# Patient Record
Sex: Male | Born: 1960 | Race: White | Hispanic: No | Marital: Single | Smoking: Current every day smoker
Health system: Southern US, Community
[De-identification: ages and names within clinical notes are randomized; demographics above are authoritative.]

## PROBLEM LIST (undated history)

## (undated) DIAGNOSIS — I10 Essential (primary) hypertension: Secondary | ICD-10-CM

## (undated) DIAGNOSIS — G8929 Other chronic pain: Secondary | ICD-10-CM

## (undated) DIAGNOSIS — J449 Chronic obstructive pulmonary disease, unspecified: Secondary | ICD-10-CM

## (undated) DIAGNOSIS — R110 Nausea: Secondary | ICD-10-CM

## (undated) DIAGNOSIS — M549 Dorsalgia, unspecified: Secondary | ICD-10-CM

## (undated) DIAGNOSIS — F329 Major depressive disorder, single episode, unspecified: Secondary | ICD-10-CM

## (undated) DIAGNOSIS — K635 Polyp of colon: Secondary | ICD-10-CM

## (undated) DIAGNOSIS — Z8719 Personal history of other diseases of the digestive system: Secondary | ICD-10-CM

## (undated) DIAGNOSIS — K5730 Diverticulosis of large intestine without perforation or abscess without bleeding: Secondary | ICD-10-CM

## (undated) DIAGNOSIS — K219 Gastro-esophageal reflux disease without esophagitis: Secondary | ICD-10-CM

## (undated) DIAGNOSIS — K625 Hemorrhage of anus and rectum: Secondary | ICD-10-CM

## (undated) DIAGNOSIS — Z860101 Personal history of adenomatous and serrated colon polyps: Secondary | ICD-10-CM

## (undated) DIAGNOSIS — F32A Depression, unspecified: Secondary | ICD-10-CM

## (undated) DIAGNOSIS — D369 Benign neoplasm, unspecified site: Secondary | ICD-10-CM

## (undated) DIAGNOSIS — R0602 Shortness of breath: Secondary | ICD-10-CM

## (undated) DIAGNOSIS — M545 Low back pain, unspecified: Secondary | ICD-10-CM

## (undated) DIAGNOSIS — R109 Unspecified abdominal pain: Secondary | ICD-10-CM

## (undated) DIAGNOSIS — A048 Other specified bacterial intestinal infections: Secondary | ICD-10-CM

## (undated) DIAGNOSIS — I219 Acute myocardial infarction, unspecified: Secondary | ICD-10-CM

## (undated) HISTORY — PX: FINGER SURGERY: SHX640

## (undated) HISTORY — DX: Benign neoplasm, unspecified site: D36.9

## (undated) HISTORY — DX: Polyp of colon: K63.5

## (undated) HISTORY — DX: Low back pain, unspecified: M54.50

## (undated) HISTORY — DX: Personal history of adenomatous and serrated colon polyps: Z86.0101

## (undated) HISTORY — DX: Low back pain: M54.5

## (undated) HISTORY — DX: Other specified bacterial intestinal infections: A04.8

## (undated) HISTORY — DX: Personal history of other diseases of the digestive system: Z87.19

## (undated) HISTORY — DX: Diverticulosis of large intestine without perforation or abscess without bleeding: K57.30

---

## 1998-11-22 DIAGNOSIS — T8859XA Other complications of anesthesia, initial encounter: Secondary | ICD-10-CM

## 1998-11-22 HISTORY — PX: BACK SURGERY: SHX140

## 1998-11-22 HISTORY — DX: Other complications of anesthesia, initial encounter: T88.59XA

## 2009-08-19 ENCOUNTER — Emergency Department (HOSPITAL_COMMUNITY): Admission: EM | Admit: 2009-08-19 | Discharge: 2009-08-19 | Payer: Self-pay | Admitting: Emergency Medicine

## 2009-08-19 IMAGING — CR DG LUMBAR SPINE COMPLETE 4+V
5 series · 5 of 5 positions shown · non-contrast
Comparison: None

CLINICAL DATA: Fall and back pain

LUMBAR SPINE - COMPLETE 4+ VIEW

[view not recorded (1 of 5)]
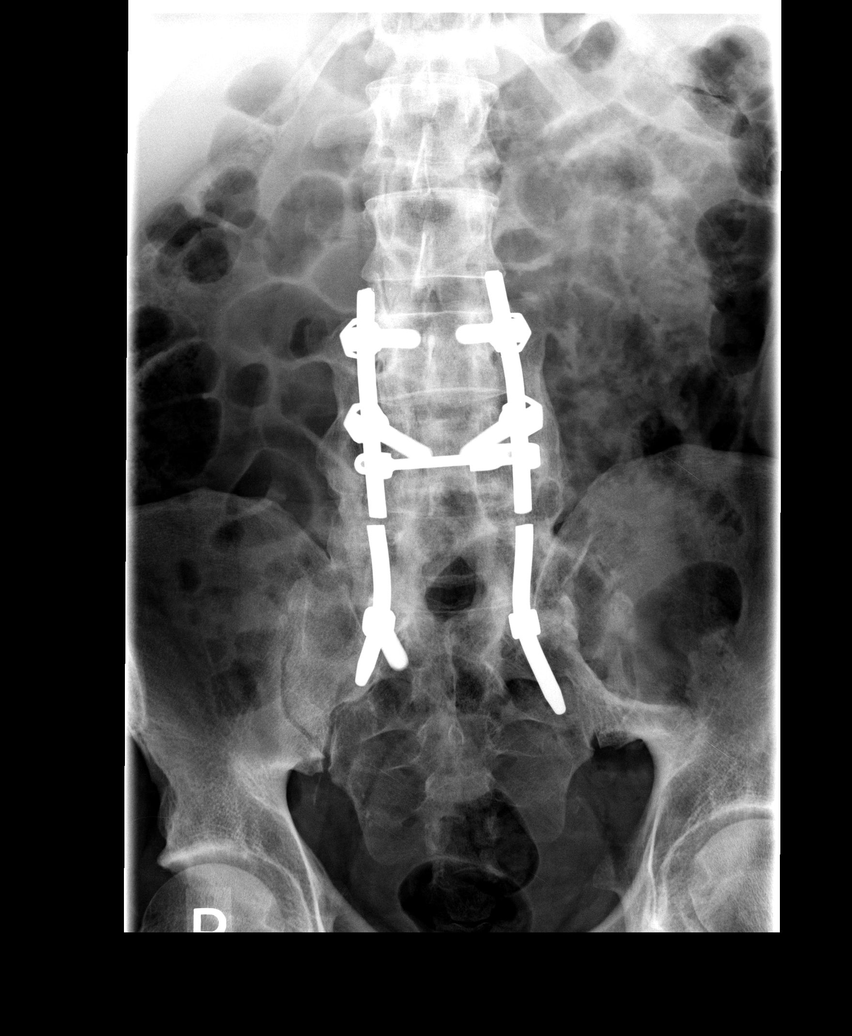

[view not recorded (2 of 5)]
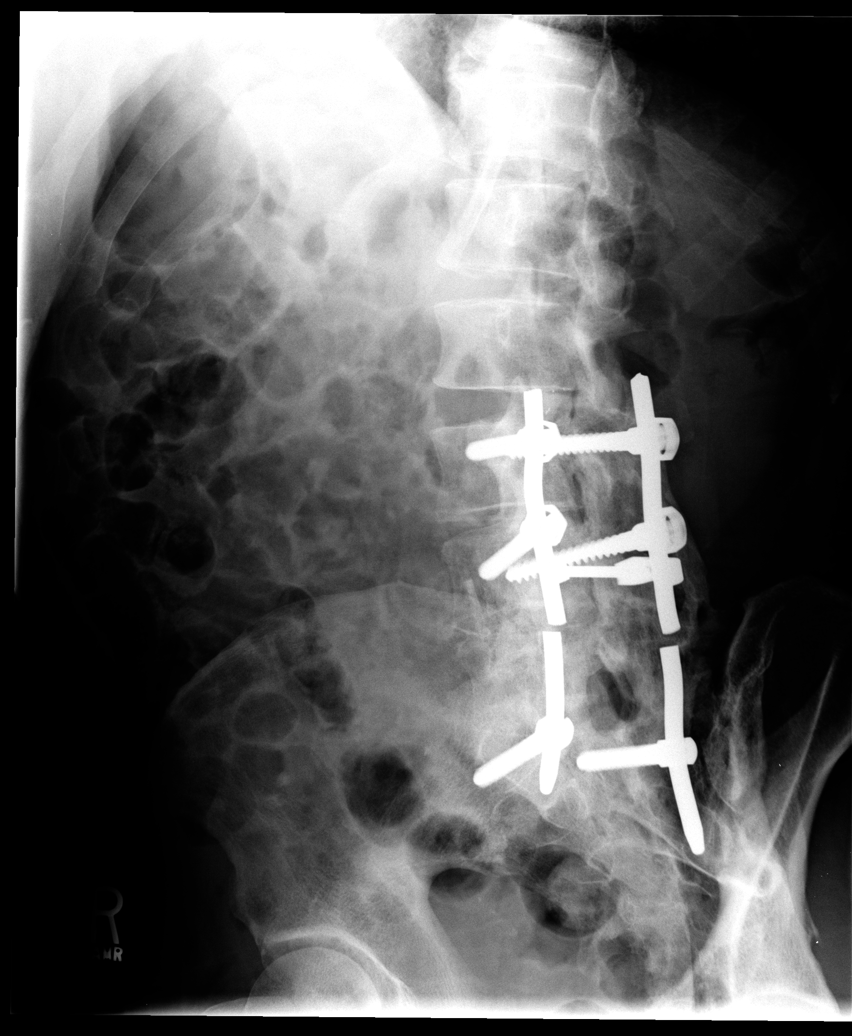

[view not recorded (3 of 5)]
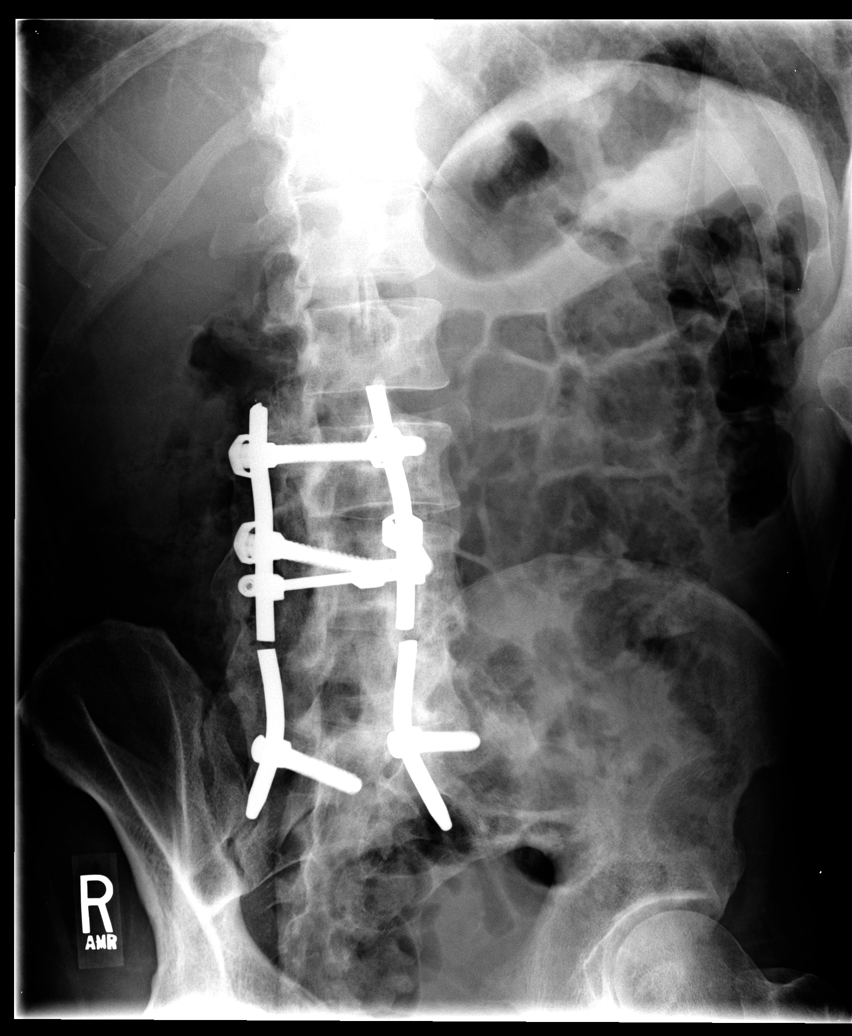

[view not recorded (4 of 5)]
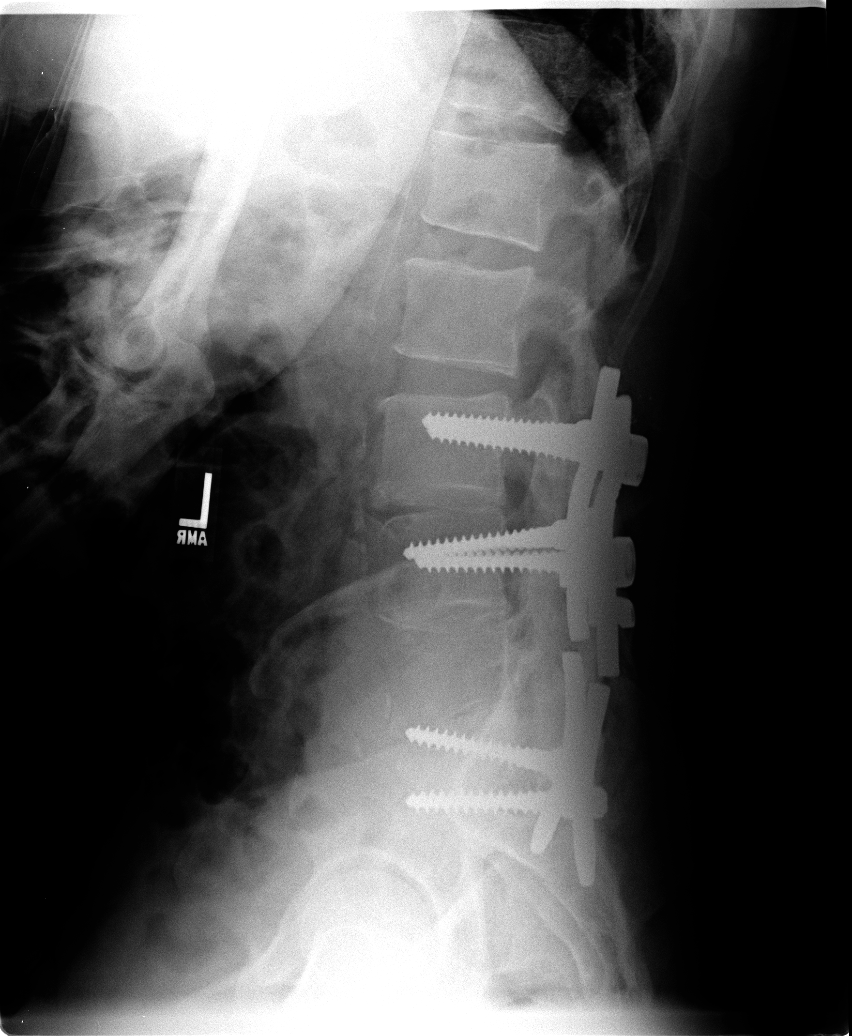

[view not recorded (5 of 5)]
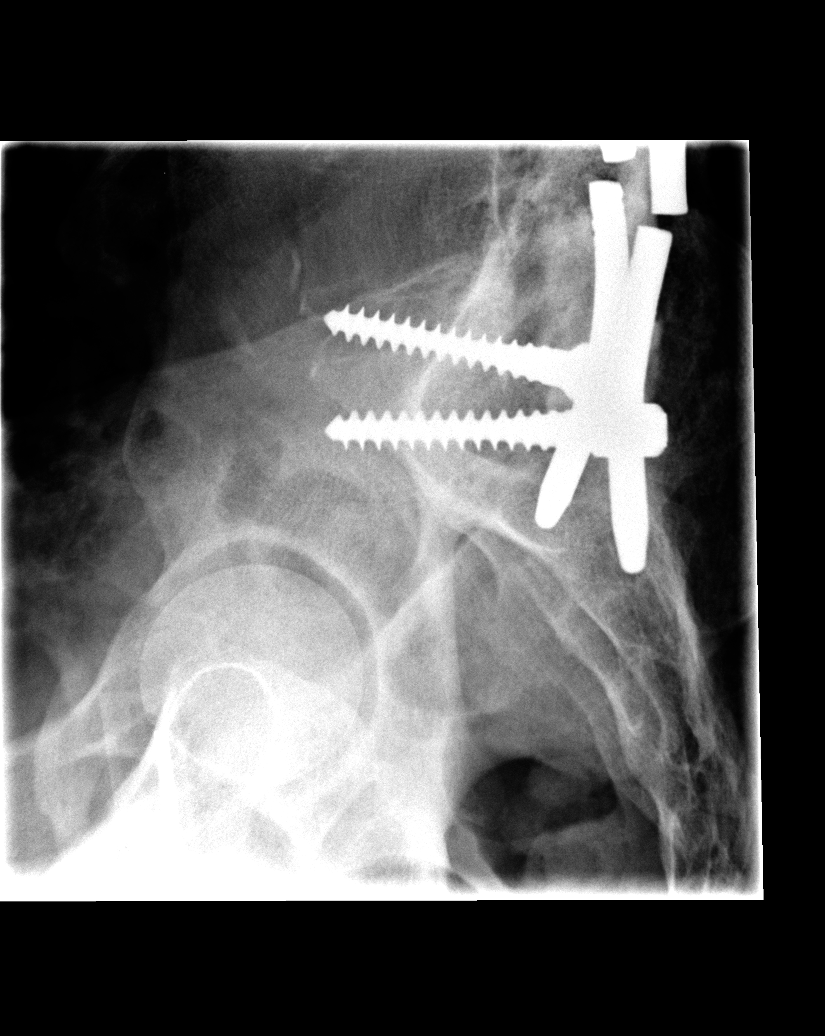

[5 of 5 positions shown; findings below may reference images not displayed]

FINDINGS: Bilateral pedicle screws are present in L3, L4, and S1.
A cross stabilizing bars, connecting the pedicle screws is
fractured at the L5 level.  L5 is demineralized.  No vertebral body
height loss.  Degenerative changes.  The L4-5 and L5 S1 disc levels
are visible.
IMPRESSION: Posterior L3-S1 fusion as described.  The cross stabilizing bars
are fractured bilaterally.  No vertebral compression deformity.

## 2009-08-19 IMAGING — CR DG CHEST 2V
3 series · 3 of 3 positions shown · non-contrast
Comparison: None

CLINICAL DATA: Back pain.

CHEST - 2 VIEW

[view not recorded (1 of 3)]
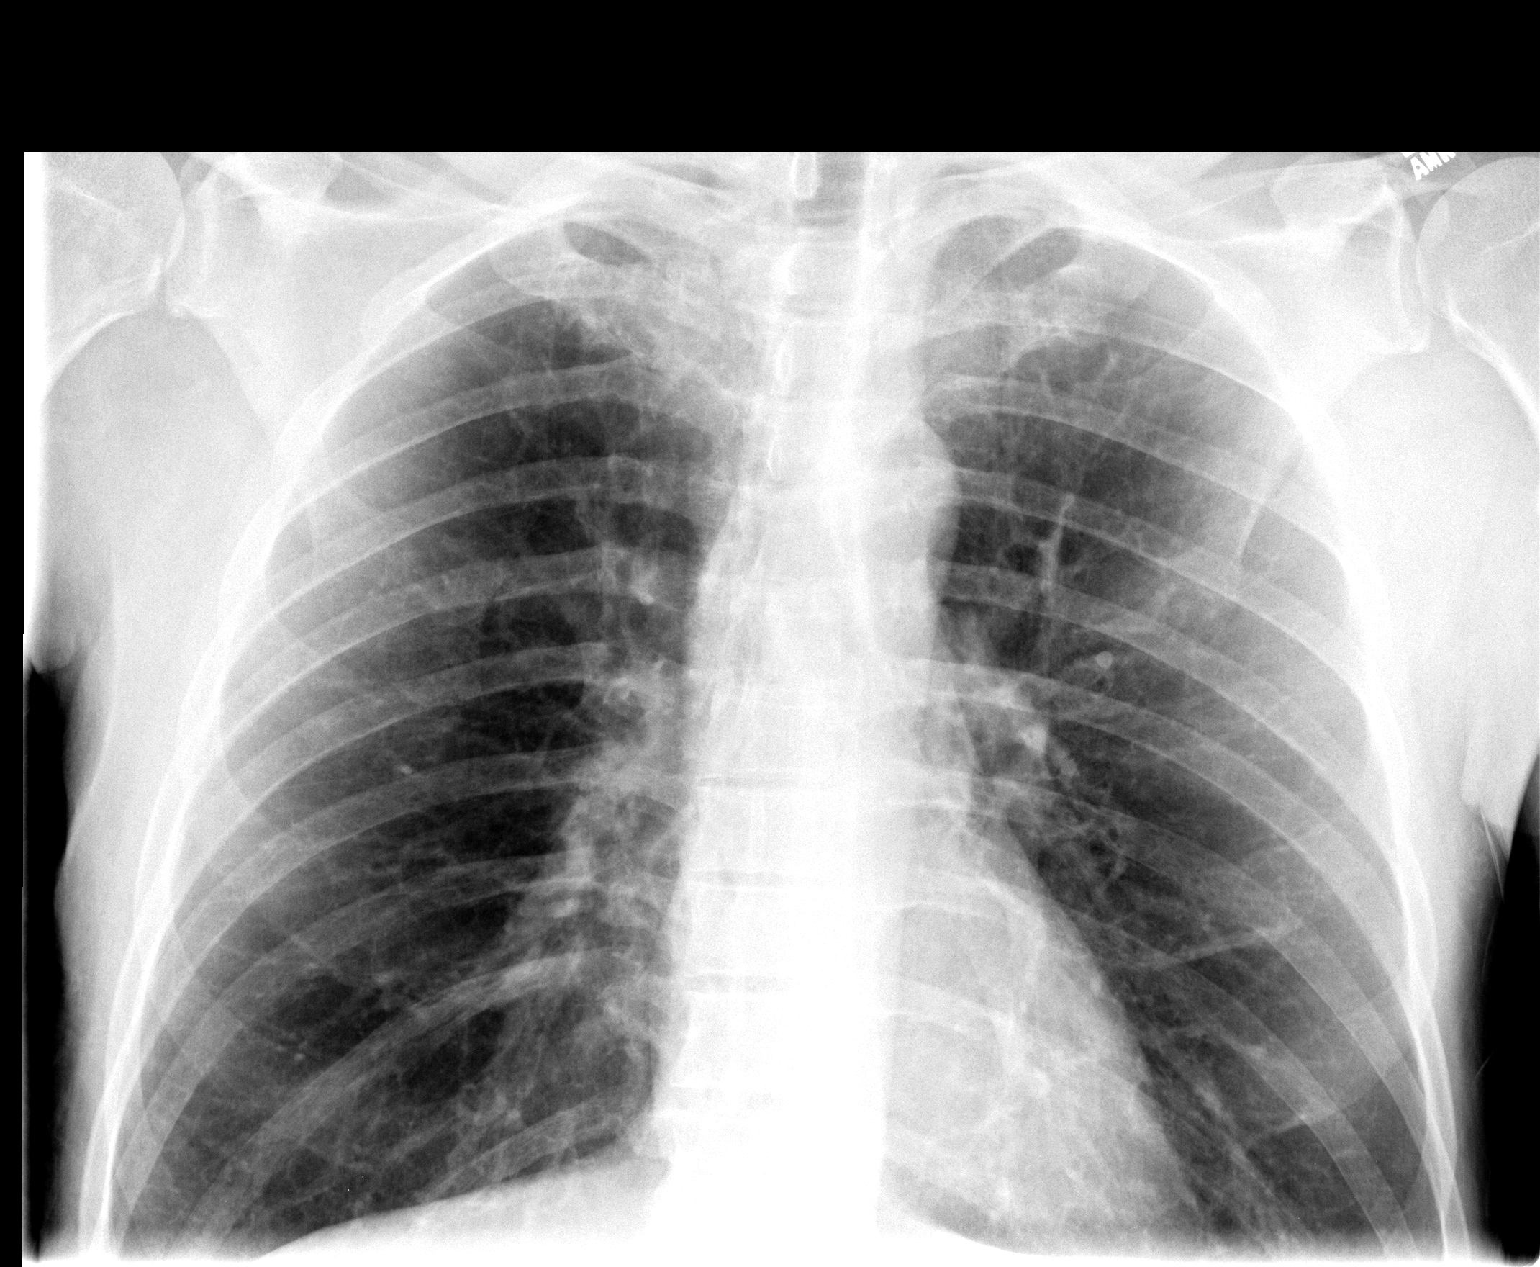

[view not recorded (2 of 3)]
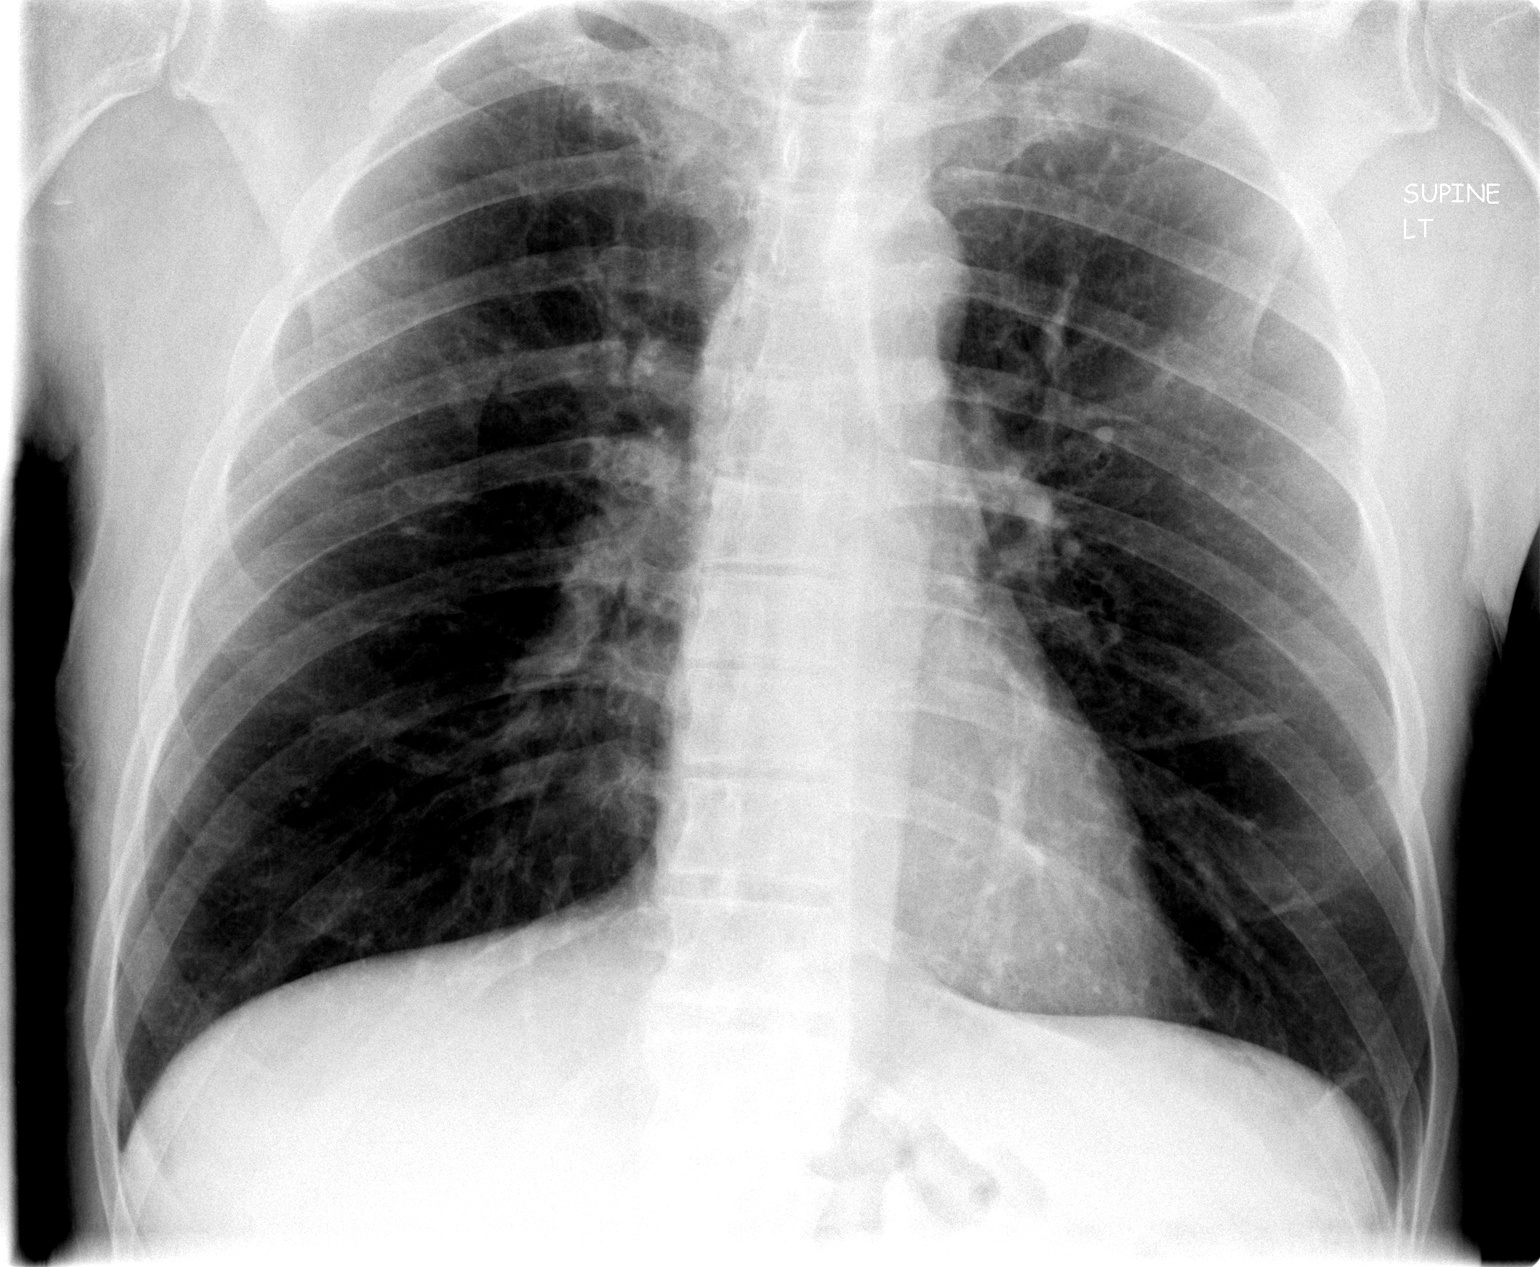

[view not recorded (3 of 3)]
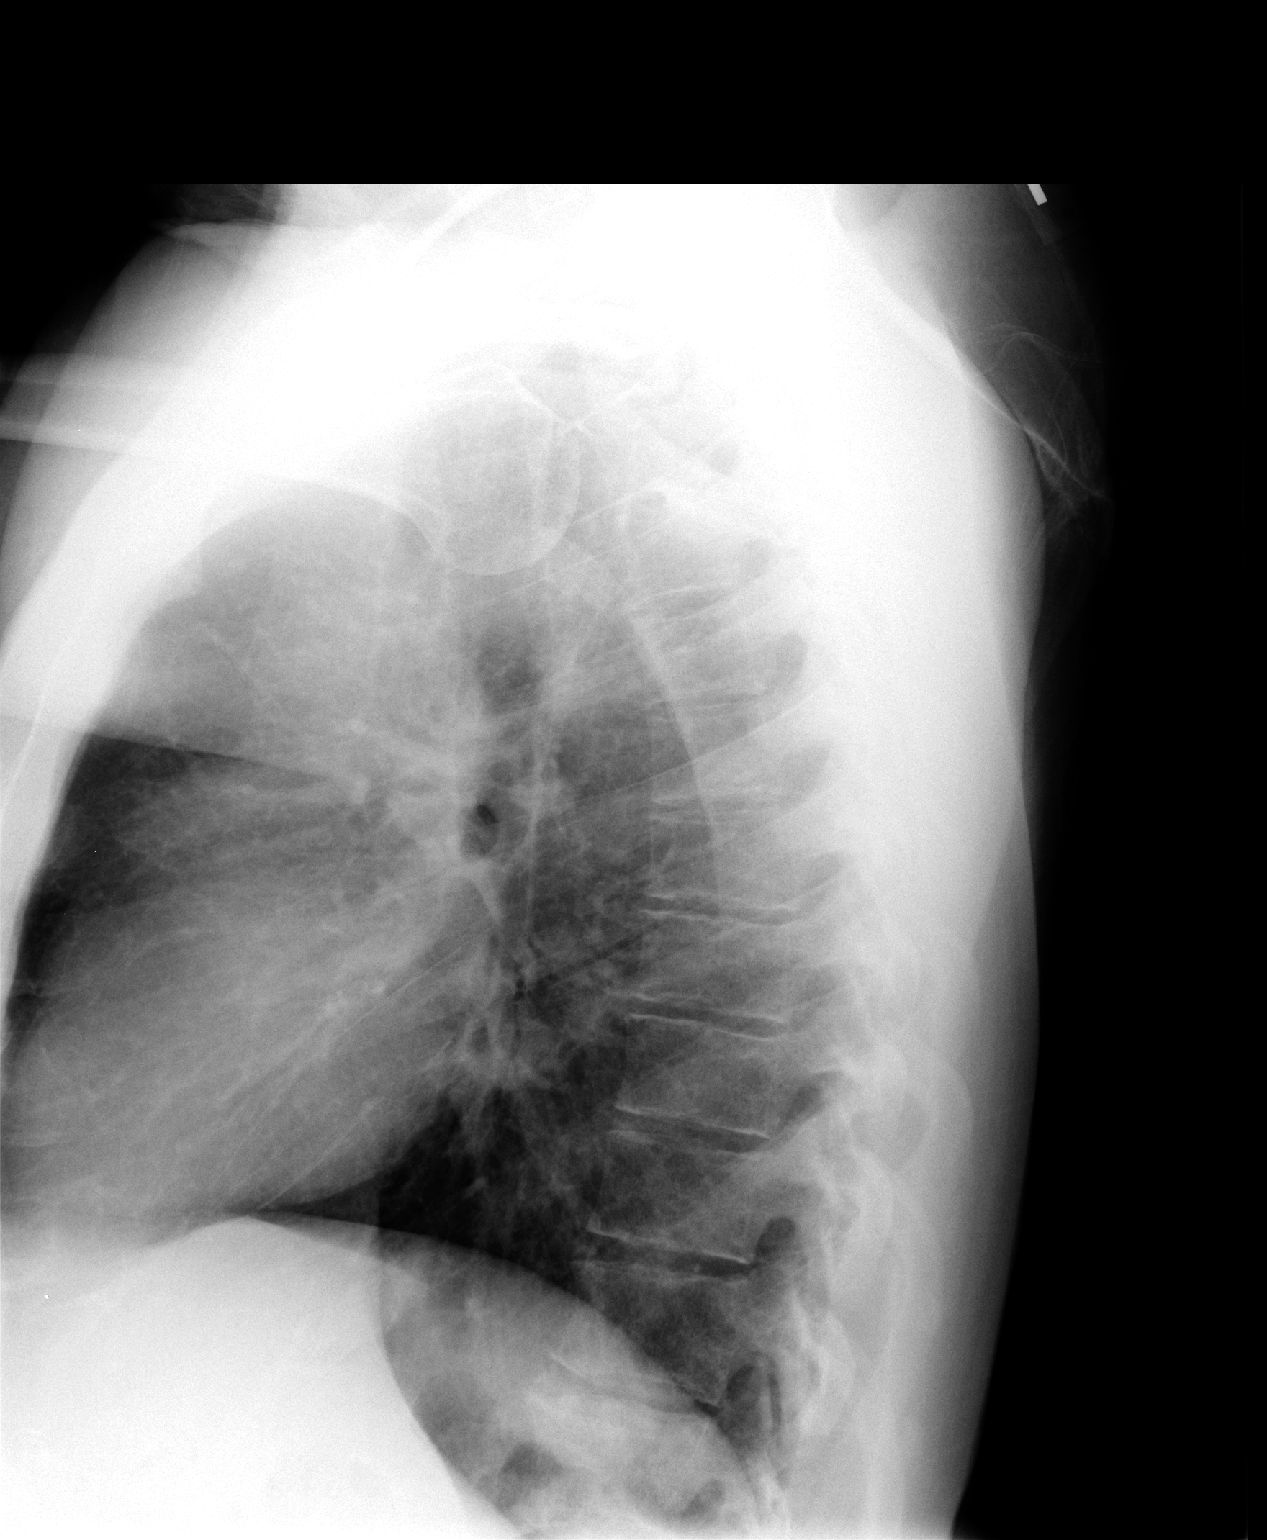

[3 of 3 positions shown; findings below may reference images not displayed]

FINDINGS: Normal heart size.  Clear lungs.  No pneumothorax.  No
pleural effusion.  No definite acute bony deformity.
IMPRESSION: No active cardiopulmonary disease.

## 2009-12-27 ENCOUNTER — Inpatient Hospital Stay: Payer: Self-pay | Admitting: Psychiatry

## 2010-04-08 ENCOUNTER — Emergency Department (HOSPITAL_COMMUNITY): Admission: EM | Admit: 2010-04-08 | Discharge: 2010-04-08 | Payer: Self-pay | Admitting: Emergency Medicine

## 2010-04-08 ENCOUNTER — Ambulatory Visit: Payer: Self-pay | Admitting: Psychiatry

## 2010-07-02 ENCOUNTER — Emergency Department (HOSPITAL_COMMUNITY)
Admission: EM | Admit: 2010-07-02 | Discharge: 2010-07-02 | Payer: Self-pay | Source: Home / Self Care | Admitting: Emergency Medicine

## 2010-09-11 ENCOUNTER — Inpatient Hospital Stay (HOSPITAL_COMMUNITY): Admission: AD | Admit: 2010-09-11 | Discharge: 2010-04-16 | Payer: Self-pay | Admitting: Psychiatry

## 2010-12-21 LAB — RAPID URINE DRUG SCREEN, HOSP PERFORMED
Amphetamines: NOT DETECTED
Barbiturates: NOT DETECTED
Benzodiazepines: NOT DETECTED
Cocaine: NOT DETECTED
Opiates: NOT DETECTED
Tetrahydrocannabinol: POSITIVE — AB

## 2010-12-21 LAB — CBC
HCT: 46.8 % (ref 39.0–52.0)
Hemoglobin: 15.8 g/dL (ref 13.0–17.0)
MCH: 31.1 pg (ref 26.0–34.0)
MCHC: 33.8 g/dL (ref 30.0–36.0)
MCV: 92 fL (ref 78.0–100.0)
Platelets: 291 10*3/uL (ref 150–400)
RBC: 5.08 MIL/uL (ref 4.22–5.81)
RDW: 13.9 % (ref 11.5–15.5)
WBC: 11.4 10*3/uL — ABNORMAL HIGH (ref 4.0–10.5)

## 2010-12-21 LAB — DIFFERENTIAL
Basophils Absolute: 0.1 10*3/uL (ref 0.0–0.1)
Basophils Relative: 1 % (ref 0–1)
Eosinophils Absolute: 0.1 10*3/uL (ref 0.0–0.7)
Eosinophils Relative: 1 % (ref 0–5)
Lymphocytes Relative: 27 % (ref 12–46)
Lymphs Abs: 3.1 10*3/uL (ref 0.7–4.0)
Monocytes Absolute: 0.6 10*3/uL (ref 0.1–1.0)
Monocytes Relative: 6 % (ref 3–12)
Neutro Abs: 7.5 10*3/uL (ref 1.7–7.7)
Neutrophils Relative %: 66 % (ref 43–77)

## 2010-12-21 LAB — BASIC METABOLIC PANEL
BUN: 9 mg/dL (ref 6–23)
CO2: 25 mEq/L (ref 19–32)
Calcium: 9.9 mg/dL (ref 8.4–10.5)
Chloride: 106 mEq/L (ref 96–112)
Creatinine, Ser: 1.06 mg/dL (ref 0.4–1.5)
GFR calc Af Amer: >60 mL/min (ref >60)
GFR calc non Af Amer: >60 mL/min (ref >60)
Glucose, Bld: 94 mg/dL (ref 70–99)
Potassium: 3.7 mEq/L (ref 3.5–5.1)
Sodium: 138 mEq/L (ref 135–145)

## 2010-12-21 LAB — ETHANOL: Alcohol, Ethyl (B): <5 mg/dL (ref 0–10)

## 2011-01-07 LAB — BASIC METABOLIC PANEL
BUN: 4 mg/dL — ABNORMAL LOW (ref 6–23)
CO2: 24 mEq/L (ref 19–32)
Calcium: 9.4 mg/dL (ref 8.4–10.5)
Chloride: 104 mEq/L (ref 96–112)
Creatinine, Ser: 1.01 mg/dL (ref 0.4–1.5)
GFR calc Af Amer: >60 mL/min (ref >60)
GFR calc non Af Amer: >60 mL/min (ref >60)
Glucose, Bld: 94 mg/dL (ref 70–99)
Potassium: 3.4 mEq/L — ABNORMAL LOW (ref 3.5–5.1)
Sodium: 138 mEq/L (ref 135–145)

## 2011-01-07 LAB — CBC
HCT: 46.8 % (ref 39.0–52.0)
Hemoglobin: 16.1 g/dL (ref 13.0–17.0)
MCHC: 34.4 g/dL (ref 30.0–36.0)
MCV: 91.6 fL (ref 78.0–100.0)
Platelets: 263 10*3/uL (ref 150–400)
RBC: 5.11 MIL/uL (ref 4.22–5.81)
RDW: 14 % (ref 11.5–15.5)
WBC: 13 10*3/uL — ABNORMAL HIGH (ref 4.0–10.5)

## 2011-01-07 LAB — DIFFERENTIAL
Basophils Absolute: 0.1 10*3/uL (ref 0.0–0.1)
Basophils Relative: 1 % (ref 0–1)
Eosinophils Absolute: 0.1 10*3/uL (ref 0.0–0.7)
Eosinophils Relative: 1 % (ref 0–5)
Lymphocytes Relative: 26 % (ref 12–46)
Lymphs Abs: 3.3 10*3/uL (ref 0.7–4.0)
Monocytes Absolute: 0.7 10*3/uL (ref 0.1–1.0)
Monocytes Relative: 5 % (ref 3–12)
Neutro Abs: 8.8 10*3/uL — ABNORMAL HIGH (ref 1.7–7.7)
Neutrophils Relative %: 68 % (ref 43–77)

## 2011-07-27 ENCOUNTER — Emergency Department (HOSPITAL_COMMUNITY): Payer: Medicaid Other

## 2011-07-27 ENCOUNTER — Emergency Department (HOSPITAL_COMMUNITY)
Admission: EM | Admit: 2011-07-27 | Discharge: 2011-07-27 | Disposition: A | Discharge status: Home / Self Care | Payer: Medicaid Other | Attending: Emergency Medicine | Admitting: Emergency Medicine

## 2011-07-27 DIAGNOSIS — S239XXA Sprain of unspecified parts of thorax, initial encounter: Secondary | ICD-10-CM | POA: Insufficient documentation

## 2011-07-27 DIAGNOSIS — W19XXXA Unspecified fall, initial encounter: Secondary | ICD-10-CM

## 2011-07-27 DIAGNOSIS — Y92009 Unspecified place in unspecified non-institutional (private) residence as the place of occurrence of the external cause: Secondary | ICD-10-CM | POA: Insufficient documentation

## 2011-07-27 DIAGNOSIS — W108XXA Fall (on) (from) other stairs and steps, initial encounter: Secondary | ICD-10-CM | POA: Insufficient documentation

## 2011-07-27 DIAGNOSIS — F17200 Nicotine dependence, unspecified, uncomplicated: Secondary | ICD-10-CM | POA: Insufficient documentation

## 2011-07-27 DIAGNOSIS — S161XXA Strain of muscle, fascia and tendon at neck level, initial encounter: Secondary | ICD-10-CM

## 2011-07-27 DIAGNOSIS — S139XXA Sprain of joints and ligaments of unspecified parts of neck, initial encounter: Secondary | ICD-10-CM | POA: Insufficient documentation

## 2011-07-27 IMAGING — CT CT T SPINE W/O CM
3 of 5 series · 11 of 33 positions shown, 13 images · non-contrast
Comparison: Chest radiograph and lumbar radiographs [DATE].

CT CERVICAL SPINE WITHOUT CONTRAST

CLINICAL DATA: 50-year-old male status post fall with pain.
TECHNIQUE: Multidetector CT imaging of the cervical spine was
performed without intravenous contrast.  Multiplanar CT image
reconstructions were also generated.
TECHNIQUE: Multidetector CT imaging of the thoracic spine was
performed without intravenous contrast administration. Multiplanar
CT image reconstructions were also generated.
TECHNIQUE: Multidetector CT imaging of the lumbar spine was
performed without intravenous contrast administration.  Multiplanar

[Series 3: thoracic spine 2.0 b30s · axial · 0.30mm/px · z∈[-305,+63]mm · 5 of 266 slices shown, 7 images]
[im 41/266  soft-tissue]
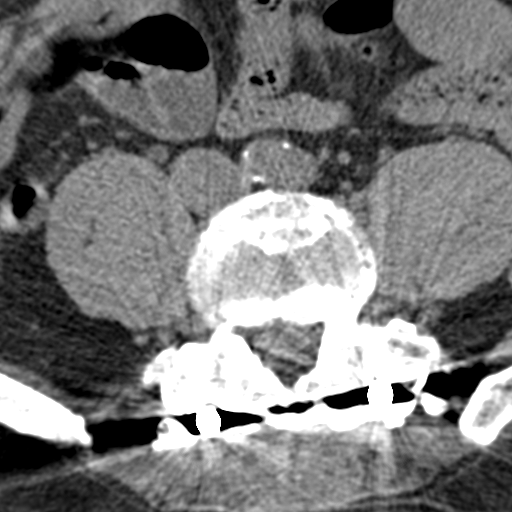
[im 41/266  bone]
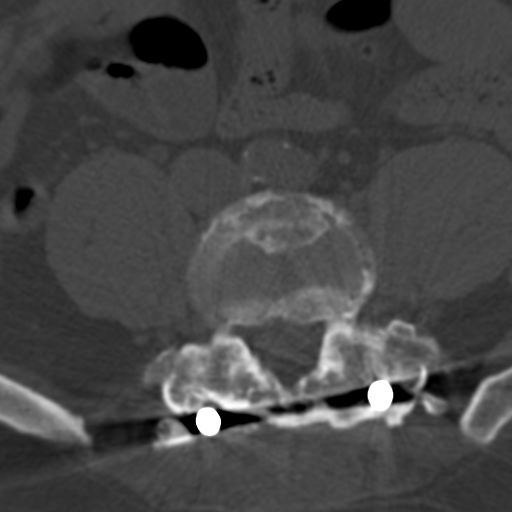
[im 82/266  bone]
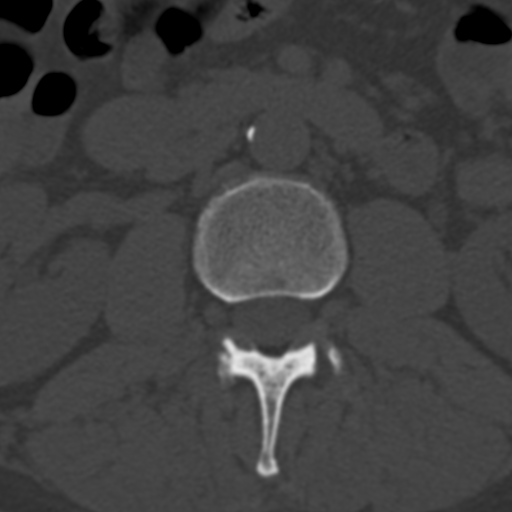
[im 143/266  bone]
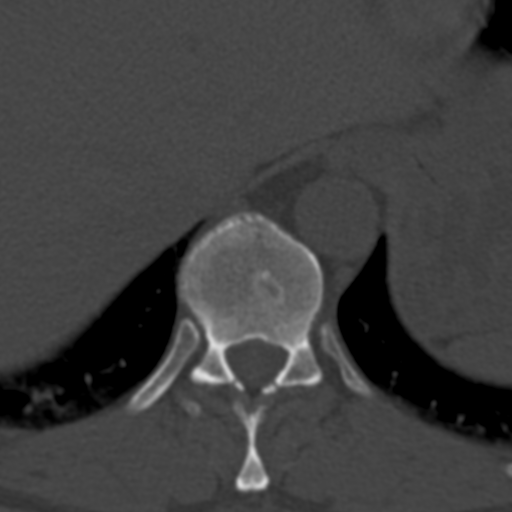
[im 184/266  bone]
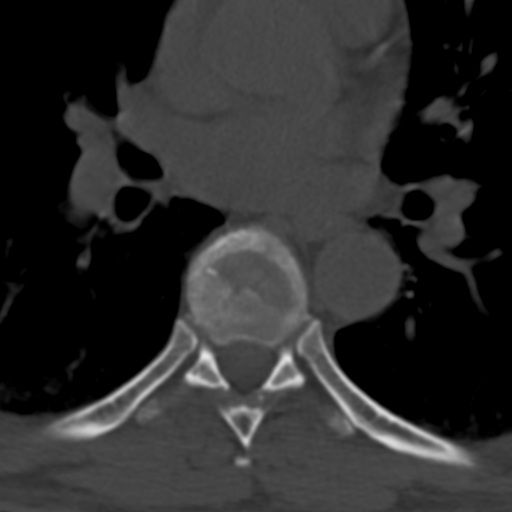
[im 225/266  soft-tissue]
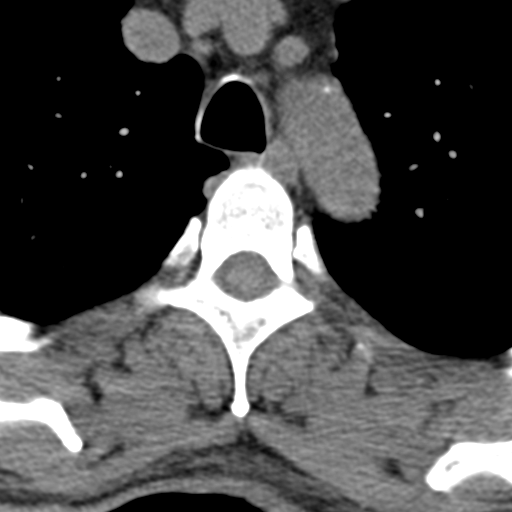
[im 225/266  bone]
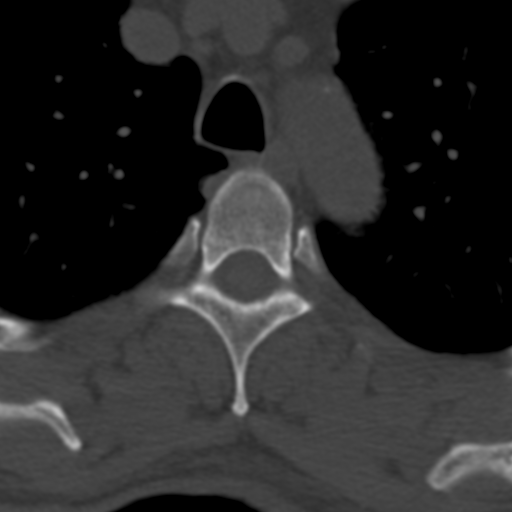

[Series 5: thoracic spine 2.0 spo · coronal · 0.34mm/px · 1 of 70 slices shown]
[im 35/70  bone]
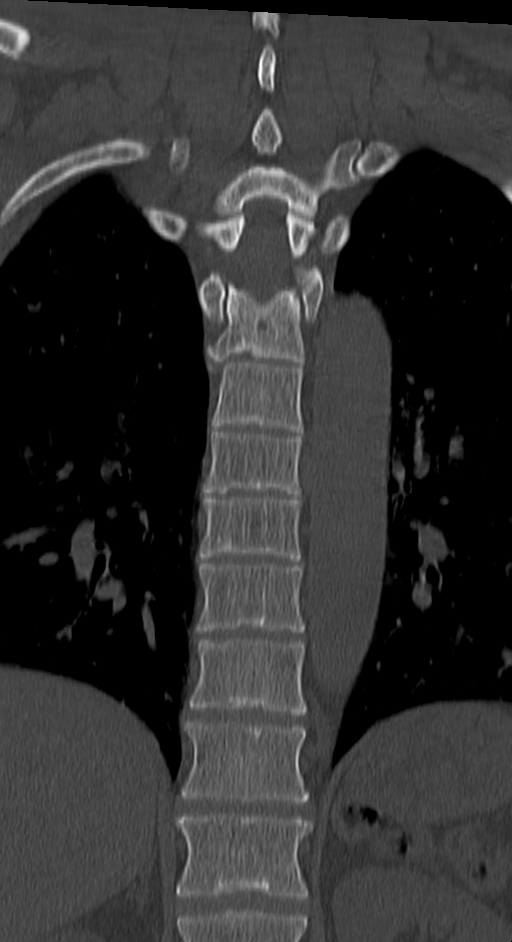

[Series 8: lspine 2.0 spo · sagittal · 0.28mm/px · 5 of 58 slices shown]
[im 10/58  bone]
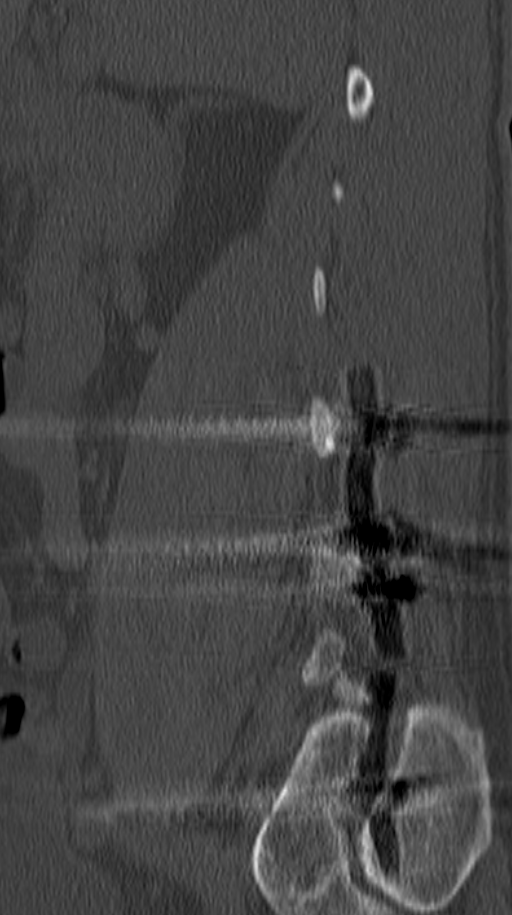
[im 20/58  bone]
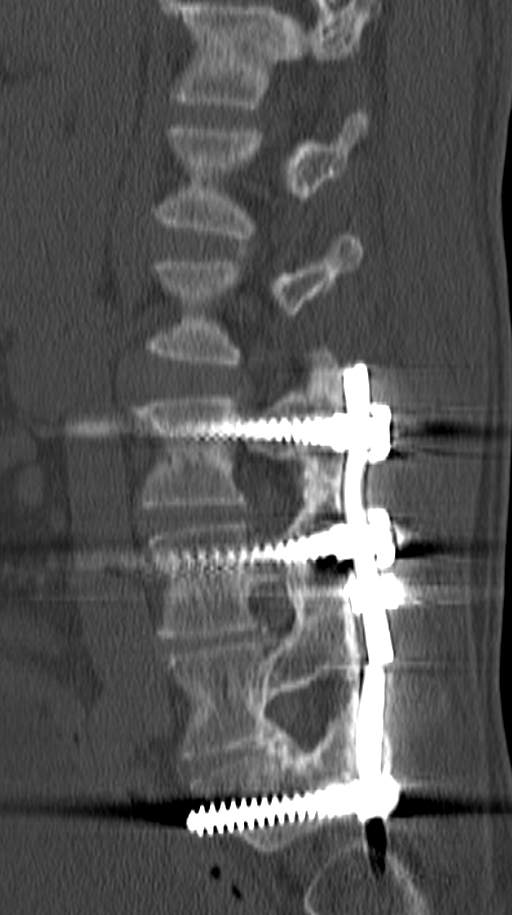
[im 29/58  bone]
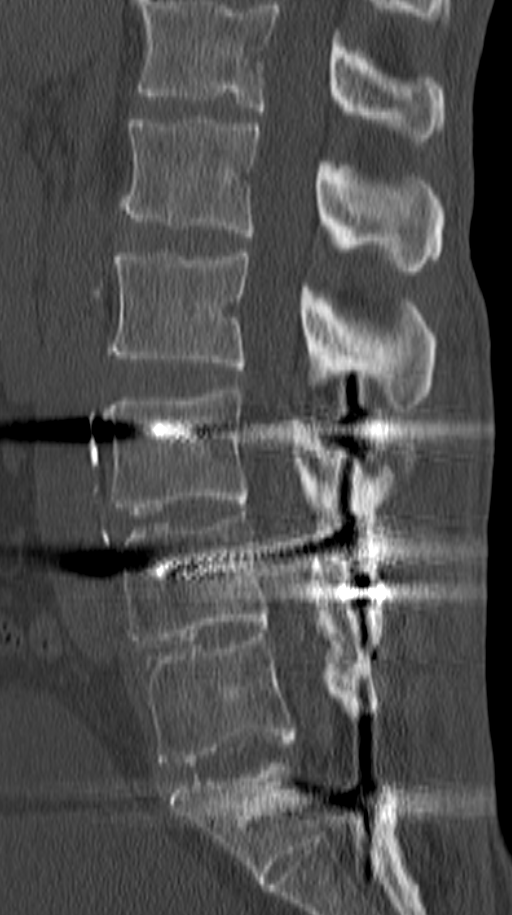
[im 39/58  bone]
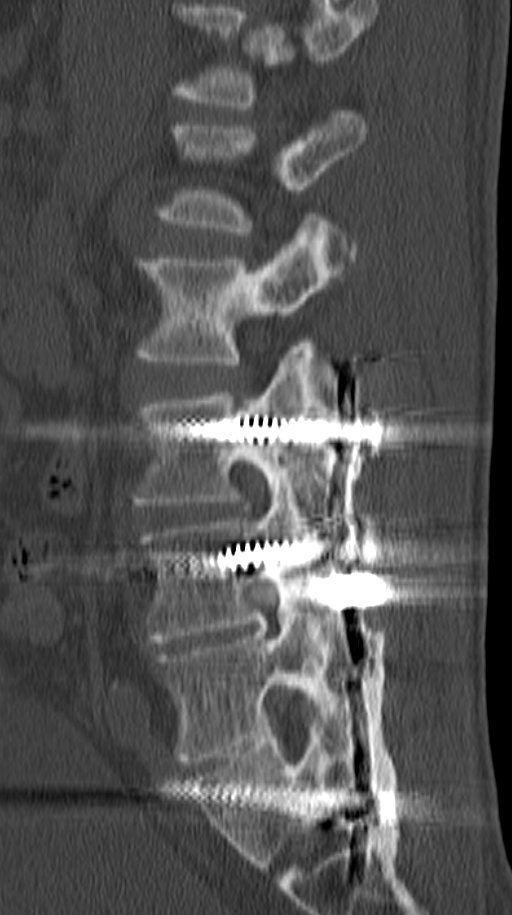
[im 48/58  bone]
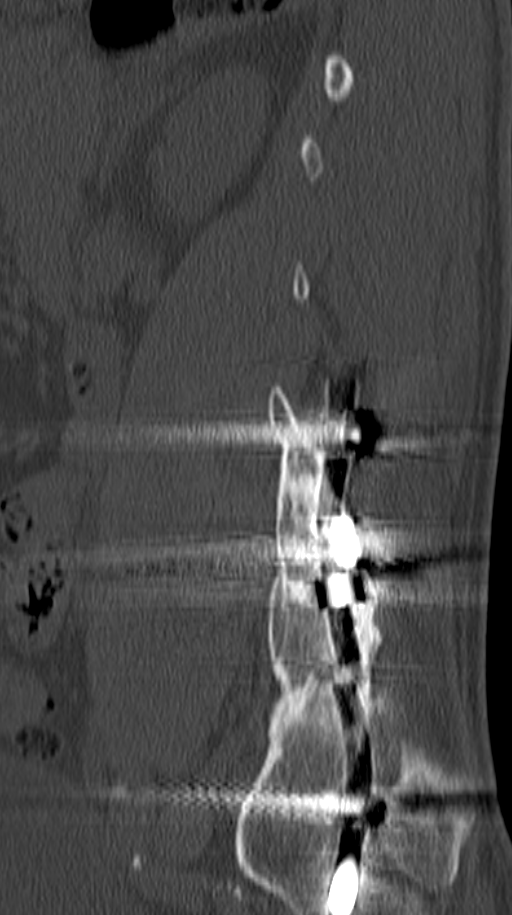

[11 of 33 positions shown; findings below may reference images not displayed]

FINDINGS: Negative visualized posterior fossa soft tissues.
Visualized paraspinal soft tissues are within normal limits.

Straightening of cervical lordosis. Cervicothoracic junction
alignment is within normal limits.  Bilateral posterior element
alignment is within normal limits.  Visualized skull base is
intact.  No atlanto-occipital dissociation.  Disc bulges and
endplate spurring at multiple levels, most pronounced at the C6-C7
where there is probably mild spinal stenosis (AP thecal sac 7-8
mm).  There is moderate bilateral neural foraminal stenosis also at
this level.  No acute cervical fracture.
IMPRESSION: 1.  No acute fracture or listhesis identified in the cervical
spine.  Ligamentous injury is not excluded.
2.  Cervical disc degeneration most pronounced at C6-C7 with
probable mild spinal stenosis.
3.  Thoracic and lumbar findings are below.

CT THORACIC SPINE WITHOUT CONTRAST
FINDINGS: Thoracic vertebral height and alignment appears stable
and within normal limits.  No acute fracture identified.

No CT evidence of thoracic spinal stenosis.

Negative noncontrast visualized mediastinal viscera. Visualized
paraspinal soft tissues are within normal limits.

Centrilobular emphysema.  Perihilar bronchiectasis.  Dependent
atelectasis.

Visualized posterior ribs appear intact.
IMPRESSION: 1.  No fracture or acute findings evident in the thoracic spine.
2.  Emphysema.
3.  Lumbar findings are below.

CT LUMBAR SPINE WITHOUT CONTRAST
FINDINGS: The bladder is distended.  There is calcified
atherosclerosis of the abdominal aorta and iliac arteries.
Otherwise negative visualized soft tissues.

Postoperative changes with fusion from L3-8 to the sacrum.
Hardware details below.  Stable lumbar vertebral height and
alignment.  Postoperative changes to the medial left iliac bone.
Visualized sacrum appears intact.

Upper lumbar levels are intact.  There is a mild circumferential
disc bulge at L2-L3 plus severe posterior element hypertrophy.  No
significant spinal stenosis (AP thecal sac 10-11 mm).

There is a chronic fracture of the posterior connecting rods as
seen on the [LY] comparison.  There is solid posterior element
arthrodesis from L3 to the sacrum.  Transpedicular hardware at the
L3, L4, and S1 levels is intact without evidence of loosening.  No
fracture through these levels.  The spinal canal is intermittently
obscured by metal artifact.  No spinal stenosis is evident.
IMPRESSION: 1.  Stable and solid fusion from L3 to the sacrum.  Chronic
connecting rod fracture appears unchanged since [DATE].  Mild adjacent segment disease at L2-L3.
3.  No acute finding identified in the lumbar spine.

## 2011-07-27 IMAGING — CT CT T SPINE W/O CM
3 of 5 series · 11 of 33 positions shown, 13 images · non-contrast
Comparison: Chest radiograph and lumbar radiographs [DATE].

CT CERVICAL SPINE WITHOUT CONTRAST

CLINICAL DATA: 50-year-old male status post fall with pain.
TECHNIQUE: Multidetector CT imaging of the cervical spine was
performed without intravenous contrast.  Multiplanar CT image
reconstructions were also generated.
TECHNIQUE: Multidetector CT imaging of the thoracic spine was
performed without intravenous contrast administration. Multiplanar
CT image reconstructions were also generated.
TECHNIQUE: Multidetector CT imaging of the lumbar spine was
performed without intravenous contrast administration.  Multiplanar

[Series 4: cervical bone cp portal · axial · portal-venous · 0.30mm/px · z∈[+105,+233]mm · 3 of 194 slices shown, 4 images]
[im 33/194  soft-tissue]
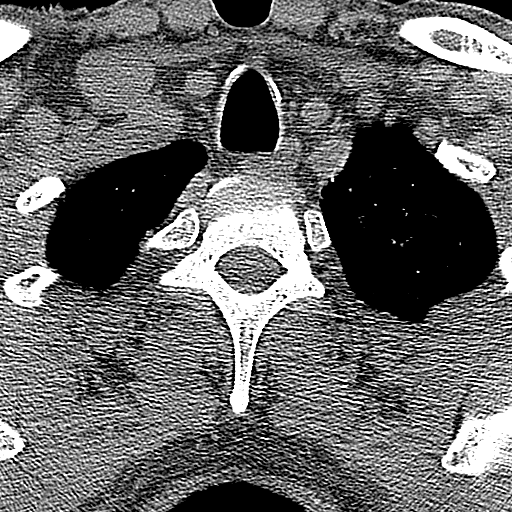
[im 33/194  bone]
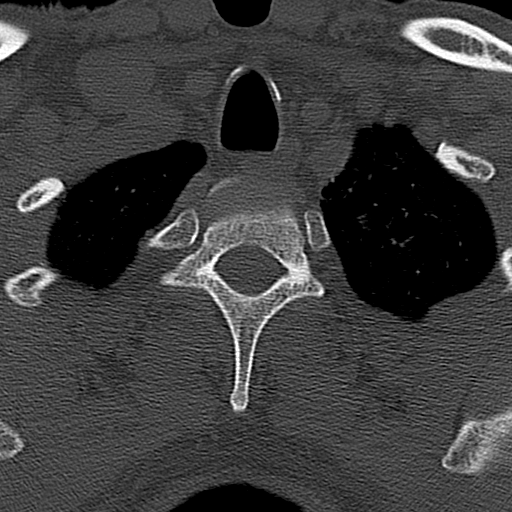
[im 97/194  bone]
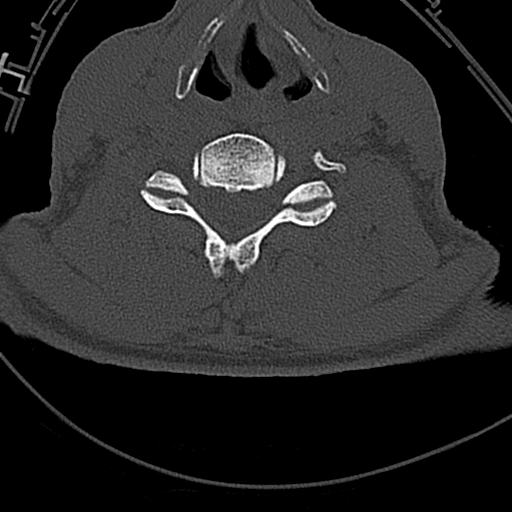
[im 161/194  bone]
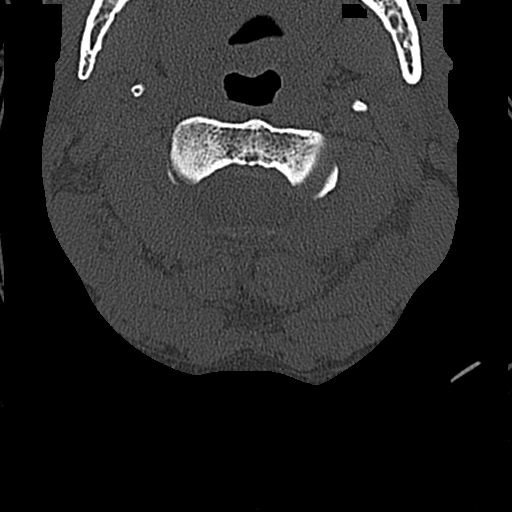

[Series 5: cervical spine sagittal bone · sagittal · 0.25mm/px · 5 of 47 slices shown, 6 images]
[im 16/47  bone]
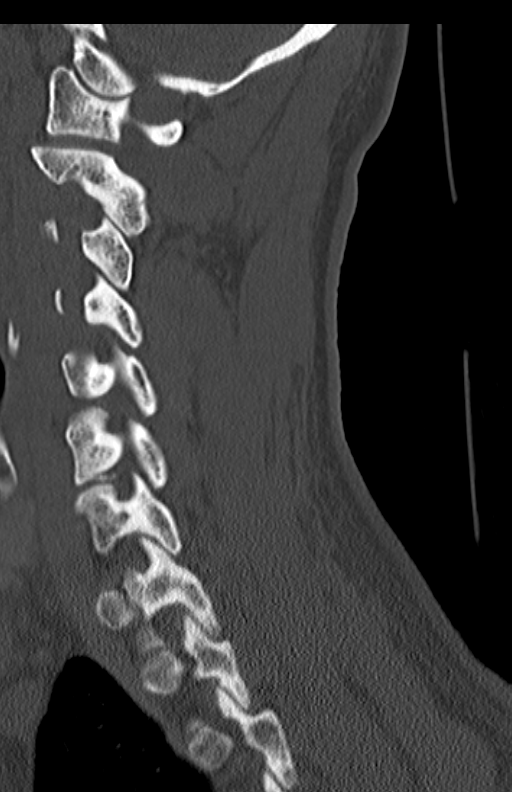
[im 20/47  bone]
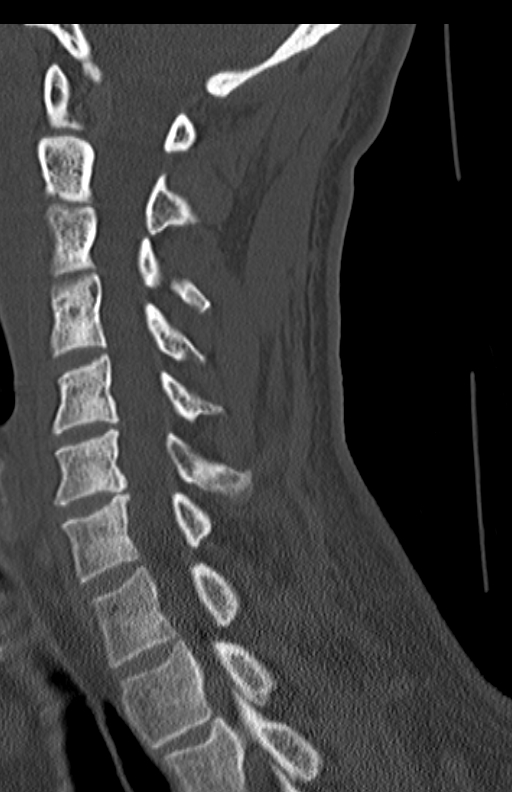
[im 24/47  soft-tissue]
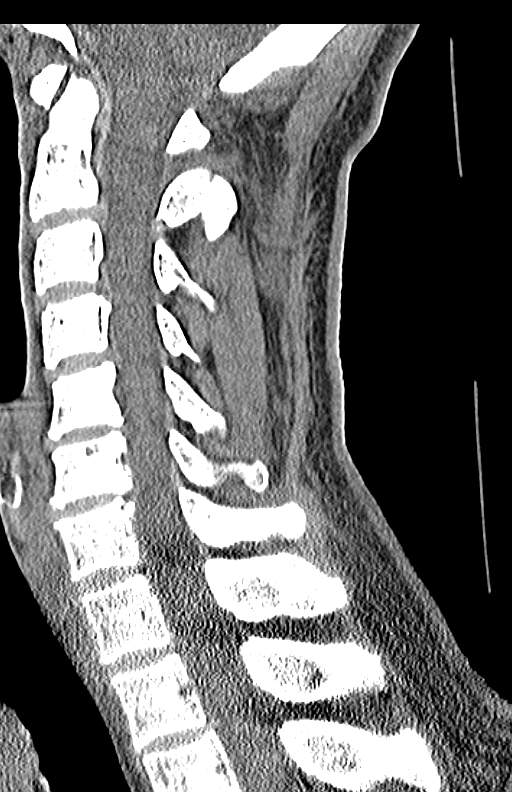
[im 24/47  bone]
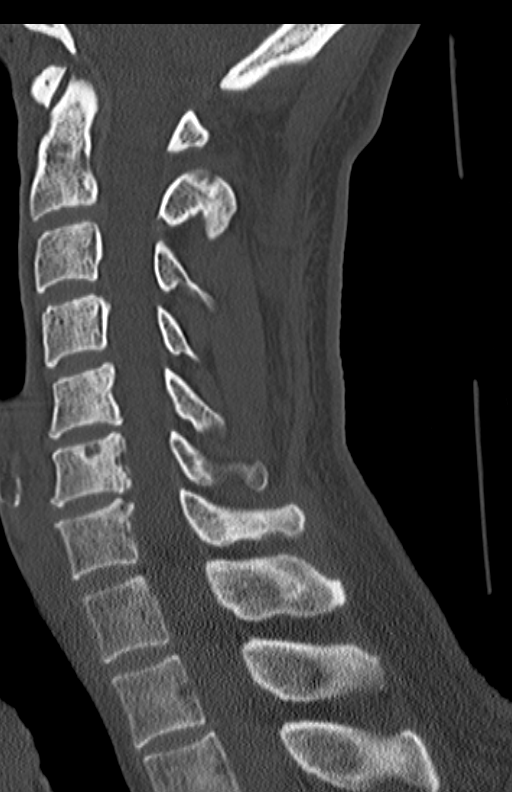
[im 27/47  bone]
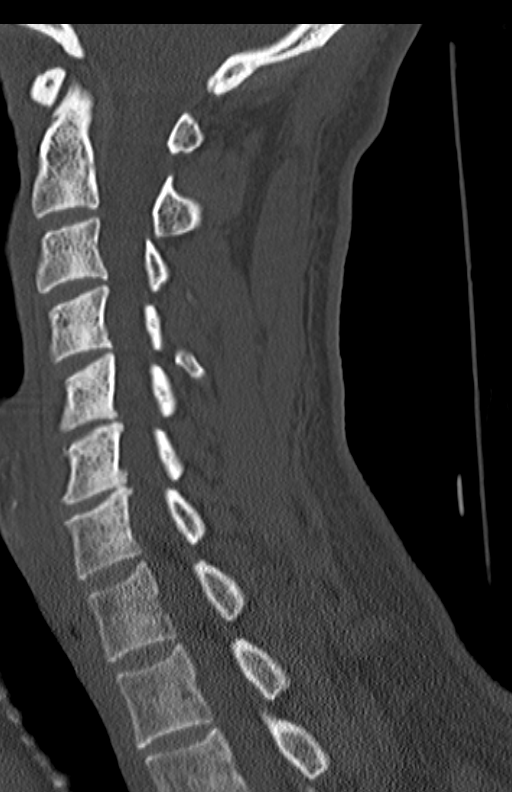
[im 31/47  bone]
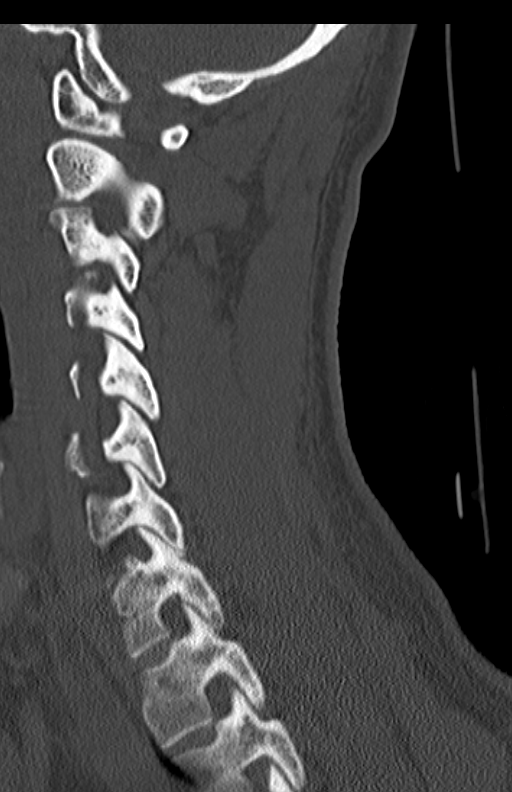

[Series 6: cervical spine coronal bone · coronal · 0.25mm/px · 3 of 65 slices shown]
[im 13/65  bone]
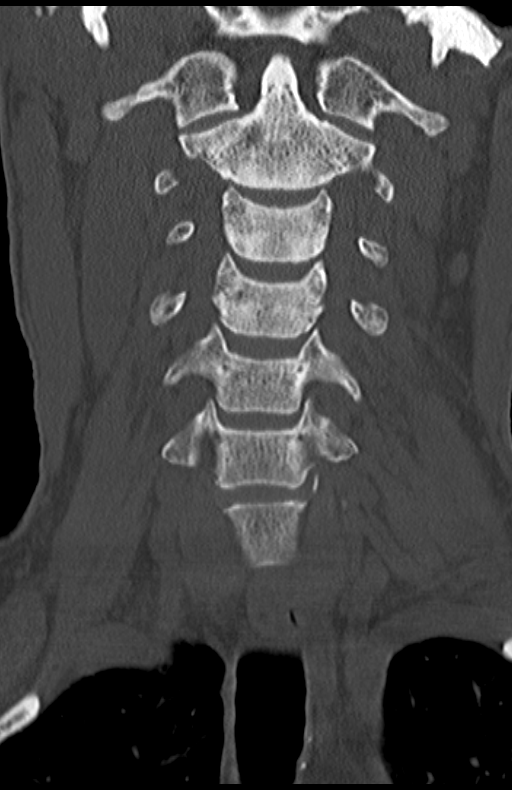
[im 26/65  bone]
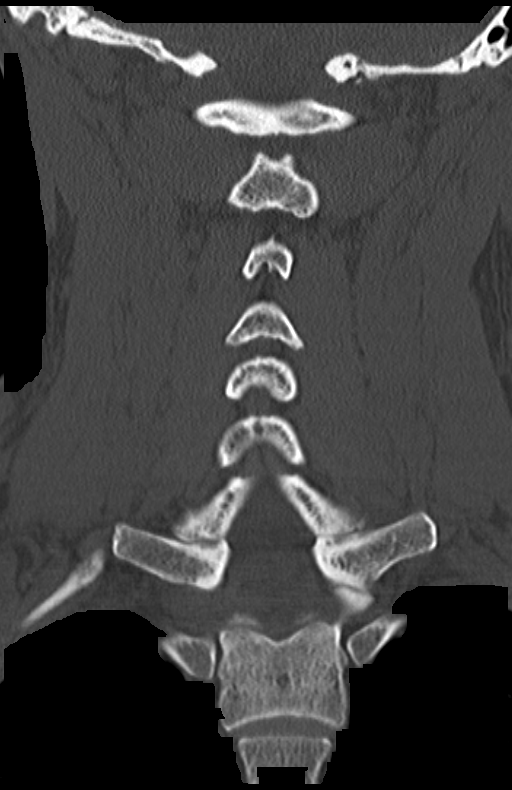
[im 39/65  bone]
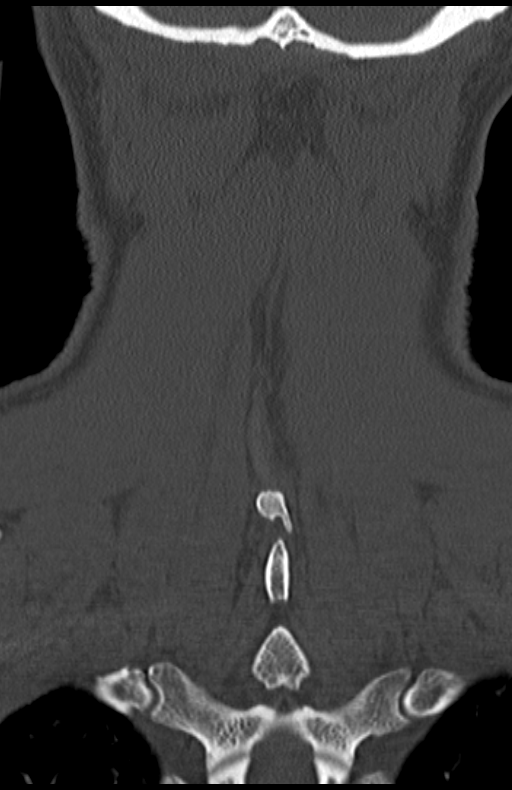

[11 of 33 positions shown; findings below may reference images not displayed]

FINDINGS: Negative visualized posterior fossa soft tissues.
Visualized paraspinal soft tissues are within normal limits.

Straightening of cervical lordosis. Cervicothoracic junction
alignment is within normal limits.  Bilateral posterior element
alignment is within normal limits.  Visualized skull base is
intact.  No atlanto-occipital dissociation.  Disc bulges and
endplate spurring at multiple levels, most pronounced at the C6-C7
where there is probably mild spinal stenosis (AP thecal sac 7-8
mm).  There is moderate bilateral neural foraminal stenosis also at
this level.  No acute cervical fracture.
IMPRESSION: 1.  No acute fracture or listhesis identified in the cervical
spine.  Ligamentous injury is not excluded.
2.  Cervical disc degeneration most pronounced at C6-C7 with
probable mild spinal stenosis.
3.  Thoracic and lumbar findings are below.

CT THORACIC SPINE WITHOUT CONTRAST
FINDINGS: Thoracic vertebral height and alignment appears stable
and within normal limits.  No acute fracture identified.

No CT evidence of thoracic spinal stenosis.

Negative noncontrast visualized mediastinal viscera. Visualized
paraspinal soft tissues are within normal limits.

Centrilobular emphysema.  Perihilar bronchiectasis.  Dependent
atelectasis.

Visualized posterior ribs appear intact.
IMPRESSION: 1.  No fracture or acute findings evident in the thoracic spine.
2.  Emphysema.
3.  Lumbar findings are below.

CT LUMBAR SPINE WITHOUT CONTRAST
FINDINGS: The bladder is distended.  There is calcified
atherosclerosis of the abdominal aorta and iliac arteries.
Otherwise negative visualized soft tissues.

Postoperative changes with fusion from L3-8 to the sacrum.
Hardware details below.  Stable lumbar vertebral height and
alignment.  Postoperative changes to the medial left iliac bone.
Visualized sacrum appears intact.

Upper lumbar levels are intact.  There is a mild circumferential
disc bulge at L2-L3 plus severe posterior element hypertrophy.  No
significant spinal stenosis (AP thecal sac 10-11 mm).

There is a chronic fracture of the posterior connecting rods as
seen on the [LY] comparison.  There is solid posterior element
arthrodesis from L3 to the sacrum.  Transpedicular hardware at the
L3, L4, and S1 levels is intact without evidence of loosening.  No
fracture through these levels.  The spinal canal is intermittently
obscured by metal artifact.  No spinal stenosis is evident.
IMPRESSION: 1.  Stable and solid fusion from L3 to the sacrum.  Chronic
connecting rod fracture appears unchanged since [DATE].  Mild adjacent segment disease at L2-L3.
3.  No acute finding identified in the lumbar spine.

## 2011-07-27 MED ORDER — OXYCODONE-ACETAMINOPHEN 5-325 MG PO TABS: 1.0000 | ORAL_TABLET | ORAL | Status: AC | PRN

## 2011-07-27 MED ORDER — HYDROMORPHONE HCL 1 MG/ML IJ SOLN
1.0000 mg | Freq: Once | INTRAMUSCULAR | Status: AC
  Administered 2011-07-27: 1 mg via INTRAVENOUS
  Filled 2011-07-27: qty 1

## 2011-07-27 NOTE — ED Provider Notes (Signed)
History     CSN: [id number] Arrival date & time: 07/27/2011 12:50 PM   First MD Initiated Contact with Patient 07/27/11 1305      Chief Complaint  Patient presents with  . Fall    (Consider location/radiation/quality/duration/timing/severity/associated sxs/prior treatment) Patient is a 50 y.o. male presenting with fall. The history is provided by the patient.  Fall The accident occurred 1 to 2 hours ago. Point of impact: upper back. Pain location: upper back. The pain is severe. Pertinent negatives include no visual change, no abdominal pain, no bowel incontinence, no headaches and no loss of consciousness. The symptoms are aggravated by activity. Treatment on scene includes a c-collar and a backboard.  Pt fell down about 3 steps, landing on back He reports accidental, lost balance, no dizziness/cp/sob No head injury No LOC No HA No new weakness in his arm/legs No cp/sob H/o lumbar surgery  History reviewed. No pertinent past medical history.  Past Surgical History  Procedure Date  . Back surgery     No family history on file.  History  Substance Use Topics  . Smoking status: Current Everyday Smoker  . Smokeless tobacco: Not on file  . Alcohol Use: No      Review of Systems  Gastrointestinal: Negative for abdominal pain and bowel incontinence.  Neurological: Negative for loss of consciousness and headaches.  All other systems reviewed and are negative.    Allergies  Review of patient's allergies indicates no known allergies.  Home Medications   Current Outpatient Rx  Name Route Sig Dispense Refill  . ACETAMINOPHEN 500 MG PO TABS Oral Take 500 mg by mouth every 6 (six) hours as needed. For pain     . ASPIRIN 325 MG PO TABS Oral Take 325 mg by mouth every 4 (four) hours as needed. For pain     . OXYCODONE-ACETAMINOPHEN 5-325 MG PO TABS Oral Take 1 tablet by mouth every 4 (four) hours as needed for pain. 10 tablet 0    BP 138/78  Pulse 91  Temp(Src) 98.6  F (37 C) (Oral)  Resp 20  Ht 5\' 9"  (1.753 m)  Wt 169 lb (76.658 kg)  BMI 24.96 kg/m2  SpO2 97%  Physical Exam  CONSTITUTIONAL: Well developed/well nourished HEAD AND FACE: Normocephalic/atraumatic EYES: EOMI/PERRL ENMT: Mucous membranes moist NECK: supple no meningeal signs, C-collar in place SPINE:CTL spine tender, no stepoffs.  He does have bruising/abrasion in thoracic paraspinal region Patient maintained in spinal precautions/logroll utilized CV: S1/S2 noted, no murmurs/rubs/gallops noted Chest - no chest wall tenderness/crepitance LUNGS: Lungs are clear to auscultation bilaterally, no apparent distress ABDOMEN: soft, nontender, no rebound or guarding NEURO: Pt is awake/alert, moves all extremitiesx4, GCS 15 EXTREMITIES: pulses normal, full ROM SKIN: warm, color normal    ED Course  Procedures (including critical care time)  Labs Reviewed - No data to display Ct Cervical Spine Wo Contrast  07/27/2011  *RADIOLOGY REPORT*  Clinical Data:  50 year old male status post fall with pain.  Comparison: Chest radiograph and lumbar radiographs 08/19/2009.  CT CERVICAL SPINE WITHOUT CONTRAST  Technique: Multidetector CT imaging of the cervical spine was performed without intravenous contrast.  Multiplanar CT image reconstructions were also generated.  Findings:  Negative visualized posterior fossa soft tissues. Visualized paraspinal soft tissues are within normal limits.  Straightening of cervical lordosis. Cervicothoracic junction alignment is within normal limits.  Bilateral posterior element alignment is within normal limits.  Visualized skull base is intact.  No atlanto-occipital dissociation.  Disc bulges and endplate spurring  at multiple levels, most pronounced at the C6-C7 where there is probably mild spinal stenosis (AP thecal sac 7-8 mm).  There is moderate bilateral neural foraminal stenosis also at this level.  No acute cervical fracture.  IMPRESSION: 1.  No acute fracture or  listhesis identified in the cervical spine.  Ligamentous injury is not excluded. 2.  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 3.  Thoracic and lumbar findings are below.  CT THORACIC SPINE WITHOUT CONTRAST  Technique: Multidetector CT imaging of the thoracic spine was performed without intravenous contrast administration. Multiplanar CT image reconstructions were also generated.  Findings:  Thoracic vertebral height and alignment appears stable and within normal limits.  No acute fracture identified.  No CT evidence of thoracic spinal stenosis.  Negative noncontrast visualized mediastinal viscera. Visualized paraspinal soft tissues are within normal limits.  Centrilobular emphysema.  Perihilar bronchiectasis.  Dependent atelectasis.  Visualized posterior ribs appear intact.  IMPRESSION: 1.  No fracture or acute findings evident in the thoracic spine. 2.  Emphysema. 3.  Lumbar findings are below.  CT LUMBAR SPINE WITHOUT CONTRAST  Technique:  Multidetector CT imaging of the lumbar spine was performed without intravenous contrast administration.  Multiplanar CT image reconstructions were also generated.  Findings:  The bladder is distended.  There is calcified atherosclerosis of the abdominal aorta and iliac arteries. Otherwise negative visualized soft tissues.  Postoperative changes with fusion from L3-8 to the sacrum. Hardware details below.  Stable lumbar vertebral height and alignment.  Postoperative changes to the medial left iliac bone. Visualized sacrum appears intact.  Upper lumbar levels are intact.  There is a mild circumferential disc bulge at L2-L3 plus severe posterior element hypertrophy.  No significant spinal stenosis (AP thecal sac 10-11 mm).  There is a chronic fracture of the posterior connecting rods as seen on the 2010 comparison.  There is solid posterior element arthrodesis from L3 to the sacrum.  Transpedicular hardware at the L3, L4, and S1 levels  is intact without evidence of loosening.  No fracture through these levels.  The spinal canal is intermittently obscured by metal artifact.  No spinal stenosis is evident.  IMPRESSION: 1.  Stable and solid fusion from L3 to the sacrum.  Chronic connecting rod fracture appears unchanged since 2010. 2.  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 3.  No acute finding identified in the lumbar spine.  Original Report Authenticated By: Harley Hallmark, M.D.   Ct Thoracic Spine Wo Contrast  07/27/2011  *RADIOLOGY REPORT*  Clinical Data:  50 year old male status post fall with pain.  Comparison: Chest radiograph and lumbar radiographs 08/19/2009.  CT CERVICAL SPINE WITHOUT CONTRAST  Technique: Multidetector CT imaging of the cervical spine was performed without intravenous contrast.  Multiplanar CT image reconstructions were also generated.  Findings:  Negative visualized posterior fossa soft tissues. Visualized paraspinal soft tissues are within normal limits.  Straightening of cervical lordosis. Cervicothoracic junction alignment is within normal limits.  Bilateral posterior element alignment is within normal limits.  Visualized skull base is intact.  No atlanto-occipital dissociation.  Disc bulges and endplate spurring at multiple levels, most pronounced at the C6-C7 where there is probably mild spinal stenosis (AP thecal sac 7-8 mm).  There is moderate bilateral neural foraminal stenosis also at this level.  No acute cervical fracture.  IMPRESSION: 1.  No acute fracture or listhesis identified in the cervical spine.  Ligamentous injury is not excluded. 2.  Cervical disc degeneration most pronounced at  C6-C7 with probable mild spinal stenosis. 3.  Thoracic and lumbar findings are below.  CT THORACIC SPINE WITHOUT CONTRAST  Technique: Multidetector CT imaging of the thoracic spine was performed without intravenous contrast administration. Multiplanar CT image reconstructions were also generated.  Findings:  Thoracic  vertebral height and alignment appears stable and within normal limits.  No acute fracture identified.  No CT evidence of thoracic spinal stenosis.  Negative noncontrast visualized mediastinal viscera. Visualized paraspinal soft tissues are within normal limits.  Centrilobular emphysema.  Perihilar bronchiectasis.  Dependent atelectasis.  Visualized posterior ribs appear intact.  IMPRESSION: 1.  No fracture or acute findings evident in the thoracic spine. 2.  Emphysema. 3.  Lumbar findings are below.  CT LUMBAR SPINE WITHOUT CONTRAST  Technique:  Multidetector CT imaging of the lumbar spine was performed without intravenous contrast administration.  Multiplanar CT image reconstructions were also generated.  Findings:  The bladder is distended.  There is calcified atherosclerosis of the abdominal aorta and iliac arteries. Otherwise negative visualized soft tissues.  Postoperative changes with fusion from L3-8 to the sacrum. Hardware details below.  Stable lumbar vertebral height and alignment.  Postoperative changes to the medial left iliac bone. Visualized sacrum appears intact.  Upper lumbar levels are intact.  There is a mild circumferential disc bulge at L2-L3 plus severe posterior element hypertrophy.  No significant spinal stenosis (AP thecal sac 10-11 mm).  There is a chronic fracture of the posterior connecting rods as seen on the 2010 comparison.  There is solid posterior element arthrodesis from L3 to the sacrum.  Transpedicular hardware at the L3, L4, and S1 levels is intact without evidence of loosening.  No fracture through these levels.  The spinal canal is intermittently obscured by metal artifact.  No spinal stenosis is evident.  IMPRESSION: 1.  Stable and solid fusion from L3 to the sacrum.  Chronic connecting rod fracture appears unchanged since 2010. 2.  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 3.  No acute finding identified in the lumbar spine.  Original Report Authenticated By: Harley Hallmark,  M.D.   Ct Lumbar Spine Wo Contrast  07/27/2011  *RADIOLOGY REPORT*  Clinical Data:  50 year old male status post fall with pain.  Comparison: Chest radiograph and lumbar radiographs 08/19/2009.  CT CERVICAL SPINE WITHOUT CONTRAST  Technique: Multidetector CT imaging of the cervical spine was performed without intravenous contrast.  Multiplanar CT image reconstructions were also generated.  Findings:  Negative visualized posterior fossa soft tissues. Visualized paraspinal soft tissues are within normal limits.  Straightening of cervical lordosis. Cervicothoracic junction alignment is within normal limits.  Bilateral posterior element alignment is within normal limits.  Visualized skull base is intact.  No atlanto-occipital dissociation.  Disc bulges and endplate spurring at multiple levels, most pronounced at the C6-C7 where there is probably mild spinal stenosis (AP thecal sac 7-8 mm).  There is moderate bilateral neural foraminal stenosis also at this level.  No acute cervical fracture.  IMPRESSION: 1.  No acute fracture or listhesis identified in the cervical spine.  Ligamentous injury is not excluded. 2.  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 3.  Thoracic and lumbar findings are below.  CT THORACIC SPINE WITHOUT CONTRAST  Technique: Multidetector CT imaging of the thoracic spine was performed without intravenous contrast administration. Multiplanar CT image reconstructions were also generated.  Findings:  Thoracic vertebral height and alignment appears stable and within normal limits.  No acute fracture identified.  No CT evidence  of thoracic spinal stenosis.  Negative noncontrast visualized mediastinal viscera. Visualized paraspinal soft tissues are within normal limits.  Centrilobular emphysema.  Perihilar bronchiectasis.  Dependent atelectasis.  Visualized posterior ribs appear intact.  IMPRESSION: 1.  No fracture or acute findings evident in the thoracic  spine. 2.  Emphysema. 3.  Lumbar findings are below.  CT LUMBAR SPINE WITHOUT CONTRAST  Technique:  Multidetector CT imaging of the lumbar spine was performed without intravenous contrast administration.  Multiplanar CT image reconstructions were also generated.  Findings:  The bladder is distended.  There is calcified atherosclerosis of the abdominal aorta and iliac arteries. Otherwise negative visualized soft tissues.  Postoperative changes with fusion from L3-8 to the sacrum. Hardware details below.  Stable lumbar vertebral height and alignment.  Postoperative changes to the medial left iliac bone. Visualized sacrum appears intact.  Upper lumbar levels are intact.  There is a mild circumferential disc bulge at L2-L3 plus severe posterior element hypertrophy.  No significant spinal stenosis (AP thecal sac 10-11 mm).  There is a chronic fracture of the posterior connecting rods as seen on the 2010 comparison.  There is solid posterior element arthrodesis from L3 to the sacrum.  Transpedicular hardware at the L3, L4, and S1 levels is intact without evidence of loosening.  No fracture through these levels.  The spinal canal is intermittently obscured by metal artifact.  No spinal stenosis is evident.  IMPRESSION: 1.  Stable and solid fusion from L3 to the sacrum.  Chronic connecting rod fracture appears unchanged since 2010. 2.  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 3.  No acute finding identified in the lumbar spine.  Original Report Authenticated By: Harley Hallmark, M.D.     1. Fall   2. Thoracic sprain   3. Cervical strain       MDM  Nursing notes reviewed and considered in documentation Pt improved, ambulatory, no new complaints Stable for d/c No weakness/numbness noted in his UE, and no new weakness noted in his LE        Joya Gaskins, MD 07/27/11 2228

## 2011-07-27 NOTE — ED Notes (Signed)
Pt "lost balance and fell down 3 stairs on his back" per EMS. Denies loc. Per EMS pt has bruise to back. Pt on LSB and c-collar.

## 2011-07-27 NOTE — ED Notes (Signed)
Ambulated in hallway with no difficulty. Pt resting in bed at this time. cb at side. Will continue to monitor.

## 2011-07-27 NOTE — ED Notes (Signed)
Dr. Bebe Shaggy in room and removed pt from LSB.

## 2011-11-05 ENCOUNTER — Encounter: Payer: Self-pay | Admitting: Internal Medicine

## 2011-11-09 ENCOUNTER — Ambulatory Visit: Payer: Medicaid Other | Admitting: Gastroenterology

## 2011-11-17 ENCOUNTER — Telehealth: Payer: Self-pay | Admitting: Gastroenterology

## 2011-11-17 ENCOUNTER — Ambulatory Visit: Payer: Medicaid Other | Admitting: Gastroenterology

## 2011-11-17 NOTE — Telephone Encounter (Signed)
Pt was a no show

## 2012-02-04 NOTE — Telephone Encounter (Signed)
Unclear who referred. Appears ?FH of colon cancer. Need to set up OV for TCS.

## 2012-02-10 ENCOUNTER — Encounter: Payer: Self-pay | Admitting: Gastroenterology

## 2012-02-10 NOTE — Telephone Encounter (Signed)
Mailed letter to patient to call office to set up OV °

## 2012-02-25 ENCOUNTER — Encounter: Payer: Self-pay | Admitting: Gastroenterology

## 2012-02-25 ENCOUNTER — Ambulatory Visit (INDEPENDENT_AMBULATORY_CARE_PROVIDER_SITE_OTHER): Payer: Medicaid Other | Admitting: Gastroenterology

## 2012-02-25 VITALS — BP 135/84 | HR 117 | Temp 98.4°F | Ht 69.0 in | Wt 165.0 lb

## 2012-02-25 DIAGNOSIS — K219 Gastro-esophageal reflux disease without esophagitis: Secondary | ICD-10-CM

## 2012-02-25 DIAGNOSIS — R1013 Epigastric pain: Secondary | ICD-10-CM | POA: Insufficient documentation

## 2012-02-25 DIAGNOSIS — R079 Chest pain, unspecified: Secondary | ICD-10-CM | POA: Insufficient documentation

## 2012-02-25 DIAGNOSIS — K625 Hemorrhage of anus and rectum: Secondary | ICD-10-CM

## 2012-02-25 DIAGNOSIS — Z800 Family history of malignant neoplasm of digestive organs: Secondary | ICD-10-CM | POA: Insufficient documentation

## 2012-02-25 MED ORDER — PEG-KCL-NACL-NASULF-NA ASC-C 100 G PO SOLR: 1.0000 | Freq: Once | ORAL | Status: DC

## 2012-02-25 MED ORDER — LUBIPROSTONE 24 MCG PO CAPS: 24.0000 ug | ORAL_CAPSULE | Freq: Two times a day (BID) | ORAL | Status: AC

## 2012-02-25 MED ORDER — OMEPRAZOLE 20 MG PO CPDR: 20.0000 mg | DELAYED_RELEASE_CAPSULE | Freq: Every day | ORAL | Status: DC

## 2012-02-25 NOTE — Assessment & Plan Note (Signed)
Recommend he f/u with PCP to discuss chest pain. Consider cardiology evaluation. Some symptoms are atypical but given risk factors recommend evaluation.

## 2012-02-25 NOTE — Assessment & Plan Note (Signed)
Chronic rectal bleeding in setting of rectal pain and hard stool. Likely benign anorectal source. Lower abd pain may be secondary to constipation in setting of narcotics. Treat constipation with Amitiza (on medicaid formulary). Colonoscopy in near future. Patient with FH of CRC in father.  I have discussed the risks, alternatives, benefits with regards to but not limited to the risk of reaction to medication, bleeding, infection, perforation and the patient is agreeable to proceed. Written consent to be obtained..  Augment conscious sedation with phenergan 25mg  iv 30 mins before.

## 2012-02-25 NOTE — Progress Notes (Signed)
Primary Care Physician:  Hall, Michelle D, FNP, FNP  Primary Gastroenterologist:  Michael Rourk, MD   Chief Complaint  Patient presents with  . Abdominal Pain    x 1 year  . Rectal Bleeding    x 1 year    HPI:  Joel Castillo is a 50 y.o. male here for further evaluation of chronic lower abdominal pain and rectal bleeding. He was no show for 11/2011 appointment. States he did not have a ride. Stabbing pain in abdomen off/on for one year. Not really related to meals. May happen while watching TV. Bends over with severe pain. Wakes up at night. BM are usually hard and associated with rectal pain. BM lately more every 2-3 days. Noticed brbpr in stool for one year. No vomiting. Bad heartburn takes TUMS, several days per week. Chronic nausea. Nocturnal heartburn. No dysphagia. C/O nonexertional chest pain, sometimes fleeting but at other times makes him cry and causes SOB. Last for 15 minutes or so. No prior w/u.   Current Outpatient Prescriptions  Medication Sig Dispense Refill  . albuterol (PROVENTIL HFA;VENTOLIN HFA) 108 (90 BASE) MCG/ACT inhaler Inhale 2 puffs into the lungs every 6 (six) hours as needed.      . DULoxetine (CYMBALTA) 60 MG capsule Take 60 mg by mouth daily.      . gabapentin (NEURONTIN) 300 MG capsule Take 300 mg by mouth 3 (three) times daily.      . oxyCODONE-acetaminophen (PERCOCET) 5-325 MG per tablet Take 1 tablet by mouth every 12 (twelve) hours as needed.        Allergies as of 02/25/2012  . (No Known Allergies)   Past Medical History  Diagnosis Date  . Lumbago   . Chronic bronchitis      Past Surgical History  Procedure Date  . Back surgery 11/22/1998  . Finger surgery    Family History  Problem Relation Age of Onset  . Colon cancer Father     age? died in prison from colon cancer  . Breast cancer Mother     deceased    History   Social History  . Marital Status: Single    Spouse Name: N/A    Number of Children: 0  . Years of Education: N/A    Occupational History  . unemployed    Social History Main Topics  . Smoking status: Current Everyday Smoker  . Smokeless tobacco: None  . Alcohol Use: No  . Drug Use: No  . Sexually Active:    Other Topics Concern  . None   Social History Narrative   Grew up in orphanage.Multiple siblings but relationship with only one sister.       ROS:  General: Negative for anorexia, weight loss, fever, chills, fatigue, weakness. Eyes: Negative for vision changes.  ENT: Negative for hoarseness, difficulty swallowing , nasal congestion. CV: see hpi. Negative for palpitations, dyspnea on exertion, peripheral edema.  Respiratory: Negative for dyspnea on exertion. See HPI. Uses inhaler during hot months.  GI: See history of present illness. GU:  Negative for dysuria, hematuria, urinary incontinence, urinary frequency, nocturnal urination.  MS: Negative for joint pain. chronic low back pain.  Derm: Negative for rash or itching.  Neuro: Negative for weakness, abnormal sensation, seizure, frequent headaches, memory loss, confusion.  Psych: Negative for anxiety, depression, suicidal ideation, hallucinations.  Endo: Negative for unusual weight change.  Heme: Negative for bruising or bleeding. Allergy: Negative for rash or hives.    Physical Examination:  BP 135/84    Pulse 117  Temp(Src) 98.4 F (36.9 C) (Temporal)  Ht 5' 9" (1.753 m)  Wt 165 lb (74.844 kg)  BMI 24.37 kg/m2   General: Well-nourished, well-developed in no acute distress.  Head: Normocephalic, atraumatic.   Eyes: Conjunctiva pink, no icterus. Mouth: Oropharyngeal mucosa moist and pink , no lesions erythema or exudate. Poor dentition Neck: Supple without thyromegaly, masses, or lymphadenopathy.  Lungs: Clear to auscultation bilaterally.  Heart: Regular rate and rhythm, no murmurs rubs or gallops.  Abdomen: Bowel sounds are normal, mild lower and epig tenderness, nondistended, no hepatosplenomegaly or masses, no abdominal  bruits or    hernia , no rebound or guarding.   Rectal: defer Extremities: No lower extremity edema. No clubbing or deformities.  Neuro: Alert and oriented x 4 , grossly normal neurologically.  Skin: Warm and dry, no rash or jaundice.   Psych: Alert and cooperative, normal mood and affect.  Labs: Labs from January 2013 Total bilirubin 0.2, alkaline phosphatase 77, AST 18, ALT 9, albumin 4.2, white blood cell count 11,500, hemoglobin 15.3, hematocrit 47, MCV 94.5, platelets 286,000.  Imaging Studies: No results found.    

## 2012-02-25 NOTE — Assessment & Plan Note (Signed)
Epigastric tenderness, GERD. Start omeprazole 20mg  BID. Chronic GERD for years treated with OTC meds. EGD in near future.  I have discussed the risks, alternatives, benefits with regards to but not limited to the risk of reaction to medication, bleeding, infection, perforation and the patient is agreeable to proceed. Written consent to be obtained.  Antireflux measures.

## 2012-02-25 NOTE — Progress Notes (Signed)
Called in Rx to [facility].

## 2012-02-25 NOTE — Progress Notes (Signed)
Faxed to PCP

## 2012-02-25 NOTE — Patient Instructions (Signed)
I have sent precriptions for amitiza and omeprazole to [facility]. Take as written.

## 2012-03-05 DIAGNOSIS — A048 Other specified bacterial intestinal infections: Secondary | ICD-10-CM

## 2012-03-05 HISTORY — DX: Other specified bacterial intestinal infections: A04.8

## 2012-03-21 ENCOUNTER — Encounter (HOSPITAL_COMMUNITY): Payer: Self-pay | Admitting: Pharmacy Technician

## 2012-03-23 MED ORDER — SODIUM CHLORIDE 0.45 % IV SOLN
Freq: Once | INTRAVENOUS | Status: AC
  Administered 2012-03-24: 1000 mL via INTRAVENOUS

## 2012-03-24 ENCOUNTER — Encounter (HOSPITAL_COMMUNITY): Payer: Self-pay | Admitting: *Deleted

## 2012-03-24 ENCOUNTER — Ambulatory Visit (HOSPITAL_COMMUNITY)
Admission: RE | Admit: 2012-03-24 | Discharge: 2012-03-24 | Disposition: A | Discharge status: Home Health | Payer: Medicaid Other | Source: Ambulatory Visit | Attending: Internal Medicine | Admitting: Internal Medicine

## 2012-03-24 ENCOUNTER — Encounter (HOSPITAL_COMMUNITY)
Admission: RE | Disposition: A | Discharge status: Home Health | Payer: Self-pay | Source: Ambulatory Visit | Attending: Internal Medicine

## 2012-03-24 DIAGNOSIS — R1013 Epigastric pain: Secondary | ICD-10-CM

## 2012-03-24 DIAGNOSIS — K625 Hemorrhage of anus and rectum: Secondary | ICD-10-CM

## 2012-03-24 DIAGNOSIS — K219 Gastro-esophageal reflux disease without esophagitis: Secondary | ICD-10-CM

## 2012-03-24 DIAGNOSIS — K621 Rectal polyp: Secondary | ICD-10-CM

## 2012-03-24 DIAGNOSIS — K5730 Diverticulosis of large intestine without perforation or abscess without bleeding: Secondary | ICD-10-CM

## 2012-03-24 DIAGNOSIS — K620 Anal polyp: Secondary | ICD-10-CM

## 2012-03-24 DIAGNOSIS — D128 Benign neoplasm of rectum: Secondary | ICD-10-CM | POA: Insufficient documentation

## 2012-03-24 DIAGNOSIS — Z800 Family history of malignant neoplasm of digestive organs: Secondary | ICD-10-CM

## 2012-03-24 DIAGNOSIS — A048 Other specified bacterial intestinal infections: Secondary | ICD-10-CM | POA: Insufficient documentation

## 2012-03-24 DIAGNOSIS — D126 Benign neoplasm of colon, unspecified: Secondary | ICD-10-CM

## 2012-03-24 DIAGNOSIS — K449 Diaphragmatic hernia without obstruction or gangrene: Secondary | ICD-10-CM | POA: Insufficient documentation

## 2012-03-24 DIAGNOSIS — R933 Abnormal findings on diagnostic imaging of other parts of digestive tract: Secondary | ICD-10-CM

## 2012-03-24 DIAGNOSIS — K2940 Chronic atrophic gastritis without bleeding: Secondary | ICD-10-CM | POA: Insufficient documentation

## 2012-03-24 HISTORY — DX: Major depressive disorder, single episode, unspecified: F32.9

## 2012-03-24 HISTORY — DX: Depression, unspecified: F32.A

## 2012-03-24 HISTORY — DX: Chronic obstructive pulmonary disease, unspecified: J44.9

## 2012-03-24 HISTORY — PX: ESOPHAGOGASTRODUODENOSCOPY: SHX1529

## 2012-03-24 HISTORY — DX: Dorsalgia, unspecified: M54.9

## 2012-03-24 HISTORY — DX: Shortness of breath: R06.02

## 2012-03-24 HISTORY — PX: COLONOSCOPY: SHX174

## 2012-03-24 HISTORY — DX: Gastro-esophageal reflux disease without esophagitis: K21.9

## 2012-03-24 HISTORY — DX: Other chronic pain: G89.29

## 2012-03-24 SURGERY — COLONOSCOPY WITH ESOPHAGOGASTRODUODENOSCOPY (EGD)
Anesthesia: Moderate Sedation

## 2012-03-24 MED ORDER — SODIUM CHLORIDE 0.9 % IJ SOLN
INTRAMUSCULAR | Status: AC
  Administered 2012-03-24: 10 mL
  Filled 2012-03-24: qty 10

## 2012-03-24 MED ORDER — MIDAZOLAM HCL 5 MG/5ML IJ SOLN
INTRAMUSCULAR | Status: AC
  Filled 2012-03-24: qty 10

## 2012-03-24 MED ORDER — PROMETHAZINE HCL 25 MG/ML IJ SOLN
INTRAMUSCULAR | Status: AC
  Administered 2012-03-24: 25 mg via INTRAVENOUS
  Filled 2012-03-24: qty 1

## 2012-03-24 MED ORDER — MIDAZOLAM HCL 5 MG/5ML IJ SOLN
INTRAMUSCULAR | Status: DC | PRN
  Administered 2012-03-24: 1 mg via INTRAVENOUS
  Administered 2012-03-24: 2 mg via INTRAVENOUS

## 2012-03-24 MED ORDER — STERILE WATER FOR IRRIGATION IR SOLN
Status: DC | PRN
  Administered 2012-03-24: 10:00:00

## 2012-03-24 MED ORDER — MEPERIDINE HCL 100 MG/ML IJ SOLN
INTRAMUSCULAR | Status: DC | PRN
  Administered 2012-03-24: 50 mg via INTRAVENOUS

## 2012-03-24 MED ORDER — MEPERIDINE HCL 100 MG/ML IJ SOLN
INTRAMUSCULAR | Status: AC
  Filled 2012-03-24: qty 2

## 2012-03-24 MED ORDER — PROMETHAZINE HCL 25 MG/ML IJ SOLN
25.0000 mg | Freq: Once | INTRAMUSCULAR | Status: AC
  Administered 2012-03-24: 25 mg via INTRAVENOUS

## 2012-03-24 NOTE — Discharge Instructions (Signed)
Colonoscopy Discharge Instructions  Read the instructions outlined below and refer to this sheet in the next few weeks. These discharge instructions provide you with general information on caring for yourself after you leave the hospital. Your doctor may also give you specific instructions. While your treatment has been planned according to the most current medical practices available, unavoidable complications occasionally occur. If you have any problems or questions after discharge, call Dr. Gala Romney at [phone]. ACTIVITY  You may resume your regular activity, but move at a slower pace for the next 24 hours.   Take frequent rest periods for the next 24 hours.   Walking will help get rid of the air and reduce the bloated feeling in your belly (abdomen).   No driving for 24 hours (because of the medicine (anesthesia) used during the test).    Do not sign any important legal documents or operate any machinery for 24 hours (because of the anesthesia used during the test).  NUTRITION  Drink plenty of fluids.   You may resume your normal diet as instructed by your doctor.   Begin with a light meal and progress to your normal diet. Heavy or fried foods are harder to digest and may make you feel sick to your stomach (nauseated).   Avoid alcoholic beverages for 24 hours or as instructed.  MEDICATIONS  You may resume your normal medications unless your doctor tells you otherwise.  WHAT YOU CAN EXPECT TODAY  Some feelings of bloating in the abdomen.   Passage of more gas than usual.   Spotting of blood in your stool or on the toilet paper.  IF YOU HAD POLYPS REMOVED DURING THE COLONOSCOPY:  No aspirin products for 7 days or as instructed.   No alcohol for 7 days or as instructed.   Eat a soft diet for the next 24 hours.  FINDING OUT THE RESULTS OF YOUR TEST Not all test results are available during your visit. If your test results are not back during the visit, make an appointment  with your caregiver to find out the results. Do not assume everything is normal if you have not heard from your caregiver or the medical facility. It is important for you to follow up on all of your test results.  SEEK IMMEDIATE MEDICAL ATTENTION IF:  You have more than a spotting of blood in your stool.   Your belly is swollen (abdominal distention).   You are nauseated or vomiting.   You have a temperature over 101.  You have abdominal pain or discomfort that is severe or gets worse throughout the day. EGD Discharge instructions Please read the instructions outlined below and refer to this sheet in the next few weeks. These discharge instructions provide you with general information on caring for yourself after you leave the hospital. Your doctor may also give you specific instructions. While your treatment has been planned according to the most current medical practices available, unavoidable complications occasionally occur. If you have any problems or questions after discharge, please call your doctor. ACTIVITY You may resume your regular activity but move at a slower pace for the next 24 hours.  Take frequent rest periods for the next 24 hours.  Walking will help expel (get rid of) the air and reduce the bloated feeling in your abdomen.  No driving for 24 hours (because of the anesthesia (medicine) used during the test).  You may shower.  Do not sign any important legal documents or operate any machinery for 24  hours (because of the anesthesia used during the test).  NUTRITION Drink plenty of fluids.  You may resume your normal diet.  Begin with a light meal and progress to your normal diet.  Avoid alcoholic beverages for 24 hours or as instructed by your caregiver.  MEDICATIONS You may resume your normal medications unless your caregiver tells you otherwise.  WHAT YOU CAN EXPECT TODAY You may experience abdominal discomfort such as a feeling of fullness or "gas" pains.   FOLLOW-UP Your doctor will discuss the results of your test with you.  SEEK IMMEDIATE MEDICAL ATTENTION IF ANY OF THE FOLLOWING OCCUR: Excessive nausea (feeling sick to your stomach) and/or vomiting.  Severe abdominal pain and distention (swelling).  Trouble swallowing.  Temperature over 101 F (37.8 C).  Rectal bleeding or vomiting of blood.    Continue omeprazole.  No aspirin or nonsteroidal drugs for 10 days  Polyp information provided.  Diverticulosis information provided.  See cardiologist as previously recommended  Further recommendations to follow pending review of pathology report   Colon Polyps A polyp is extra tissue that grows inside your body. Colon polyps grow in the large intestine. The large intestine, also called the colon, is part of your digestive system. It is a long, hollow tube at the end of your digestive tract where your body makes and stores stool. Most polyps are not dangerous. They are benign. This means they are not cancerous. But over time, some types of polyps can turn into cancer. Polyps that are smaller than a pea are usually not harmful. But larger polyps could someday become or may already be cancerous. To be safe, doctors remove all polyps and test them.  WHO GETS POLYPS? Anyone can get polyps, but certain people are more likely than others. You may have a greater chance of getting polyps if:  You are over 50.   You have had polyps before.   Someone in your family has had polyps.   Someone in your family has had cancer of the large intestine.   Find out if someone in your family has had polyps. You may also be more likely to get polyps if you:   Eat a lot of fatty foods.   Smoke.   Drink alcohol.   Do not exercise.   Eat too much.  SYMPTOMS  Most small polyps do not cause symptoms. People often do not know they have one until their caregiver finds it during a regular checkup or while testing them for something else. Some people do  have symptoms like these:  Bleeding from the anus. You might notice blood on your underwear or on toilet paper after you have had a bowel movement.   Constipation or diarrhea that lasts more than a week.   Blood in the stool. Blood can make stool look black or it can show up as red streaks in the stool.  If you have any of these symptoms, see your caregiver. HOW DOES THE DOCTOR TEST FOR POLYPS? The doctor can use four tests to check for polyps:  Digital rectal exam. The caregiver wears gloves and checks your rectum (the last part of the large intestine) to see if it feels normal. This test would find polyps only in the rectum. Your caregiver may need to do one of the other tests listed below to find polyps higher up in the intestine.   Barium enema. The caregiver puts a liquid called barium into your rectum before taking x-rays of your large intestine. Barium  makes your intestine look white in the pictures. Polyps are dark, so they are easy to see.   Sigmoidoscopy. With this test, the caregiver can see inside your large intestine. A thin flexible tube is placed into your rectum. The device is called a sigmoidoscope, which has a light and a tiny video camera in it. The caregiver uses the sigmoidoscope to look at the last third of your large intestine.   Colonoscopy. This test is like sigmoidoscopy, but the caregiver looks at all of the large intestine. It usually requires sedation. This is the most common method for finding and removing polyps.  TREATMENT   The caregiver will remove the polyp during sigmoidoscopy or colonoscopy. The polyp is then tested for cancer.   If you have had polyps, your caregiver may want you to get tested regularly in the future.  PREVENTION  There is not one sure way to prevent polyps. You might be able to lower your risk of getting them if you:  Eat more fruits and vegetables and less fatty food.   Do not smoke.   Avoid alcohol.   Exercise every day.    Lose weight if you are overweight.   Eating more calcium and folate can also lower your risk of getting polyps. Some foods that are rich in calcium are milk, cheese, and broccoli. Some foods that are rich in folate are chickpeas, kidney beans, and spinach.   Aspirin might help prevent polyps. Studies are under way.  Document Released: 06/17/2004 Document Revised: 09/10/2011 Document Reviewed: 11/23/2007 [facility] Patient Information 2012 [place], [place].  Diverticulosis Diverticulosis is a common condition that develops when small pouches (diverticula) form in the wall of the colon. The risk of diverticulosis increases with age. It happens more often in people who eat a low-fiber diet. Most individuals with diverticulosis have no symptoms. Those individuals with symptoms usually experience abdominal pain, constipation, or loose stools (diarrhea). HOME CARE INSTRUCTIONS   Increase the amount of fiber in your diet as directed by your caregiver or dietician. This may reduce symptoms of diverticulosis.   Your caregiver may recommend taking a dietary fiber supplement.   Drink at least 6 to 8 glasses of water each day to prevent constipation.   Try not to strain when you have a bowel movement.   Your caregiver may recommend avoiding nuts and seeds to prevent complications, although this is still an uncertain benefit.   Only take over-the-counter or prescription medicines for pain, discomfort, or fever as directed by your caregiver.  FOODS WITH HIGH FIBER CONTENT INCLUDE:  Fruits. Apple, peach, pear, tangerine, raisins, prunes.   Vegetables. Brussels sprouts, asparagus, broccoli, cabbage, carrot, cauliflower, romaine lettuce, spinach, summer squash, tomato, winter squash, zucchini.   Starchy Vegetables. Baked beans, kidney beans, lima beans, split peas, lentils, potatoes (with skin).   Grains. Whole wheat bread, brown rice, bran flake cereal, plain oatmeal, white rice, shredded wheat, bran  muffins.  SEEK IMMEDIATE MEDICAL CARE IF:   You develop increasing pain or severe bloating.   You have an oral temperature above 102 F (38.9 C), not controlled by medicine.   You develop vomiting or bowel movements that are bloody or black.  Document Released: 06/18/2004 Document Revised: 09/10/2011 Document Reviewed: 02/19/2010 [facility] Patient Information 2012 [place], [place].

## 2012-03-24 NOTE — Op Note (Signed)
[facility] [address] [place], [place]  [address]  ENDOSCOPY PROCEDURE REPORT  PATIENT:  Joel Castillo, Joel Castillo  MR#:  [id number] BIRTHDATE:  01/27/1961, 50 yrs. old  GENDER:  male  ENDOSCOPIST:  R. Roetta Sessions, MD FACP [facility] Referred by:  Dixie Dials, FNP  PROCEDURE DATE:  03/24/2012 PROCEDURE:  EGD with gastric biopsy  INDICATIONS:   epigastric pain GERD. Symptoms recently improved dramatically since being started on omeprazole 20 mg orally twice daily through our office.  INFORMED CONSENT:   The risks, benefits, limitations, alternatives and imponderables have been discussed.  The potential for biopsy, esophogeal dilation, etc. have also been reviewed.  Questions have been answered.  All parties agreeable.  Please see the history and physical in the medical record for more information.  MEDICATIONS:     Versed 3 mg IV and Demerol 50 mg IV in divided doses. Cetacaine spray.  DESCRIPTION OF PROCEDURE:   The EG-[id number] ([id number]) endoscope was introduced through the mouth and advanced to the second portion of the duodenum without difficulty or limitations.  The mucosal surfaces were surveyed very carefully during advancement of the scope and upon withdrawal.  Retroflexion view of the proximal stomach and esophagogastric junction was performed.  <<PROCEDUREIMAGES>>  FINDINGS:  Normal appearing tubular esophagus. Patulous esophagogastric junction. Stomach empty. Diffuse submucosal petechiae.     moderate-sized hiatal hernia. No ulcer or infiltrating process. Pylorus patent. Examination of the duodenal bulb multiple     erosions but no ulcer found the second portion of the duodenum appeared normal.  THERAPEUTIC / DIAGNOSTIC MANEUVERS PERFORMED:  Biopsies of gastric antrum and body were taken for histologic study.  COMPLICATIONS:   None  IMPRESSION:    Patulous EG junction. Moderate size hiatal hernia. Abnormal gastric mucosa-status post biopsy. Duodenal  erosions.  RECOMMENDATIONS: Follow-up on pathology report. See colonoscopy report. Cardiology consultation  as previously recommended.  ______________________________ R. Roetta Sessions, MD Caleen Essex  CC:  n. eSIGNED:   R. Roetta Sessions at 03/24/2012 10:14 AM  Erenest Rasher, [phone]

## 2012-03-24 NOTE — Interval H&P Note (Signed)
History and Physical Interval Note:  03/24/2012 9:48 AM  Joel Castillo  has presented today for surgery, with the diagnosis of rectal bleeding , abd pain  The various methods of treatment have been discussed with the patient and family. After consideration of risks, benefits and other options for treatment, the patient has consented to  Procedure(s) (LRB): COLONOSCOPY WITH ESOPHAGOGASTRODUODENOSCOPY (EGD) (N/A) as a surgical intervention .  The patient's history has been reviewed, patient examined, no change in status, stable for surgery.  I have reviewed the patients' chart and labs.  Questions were answered to the patient's satisfaction.     Eula Listen

## 2012-03-24 NOTE — H&P (View-Only) (Signed)
Primary Care Physician:  Theodora Blow, FNP, FNP  Primary Gastroenterologist:  Roetta Sessions, MD   Chief Complaint  Patient presents with  . Abdominal Pain    x 1 year  . Rectal Bleeding    x 1 year    HPI:  Joel Castillo is a 51 y.o. male here for further evaluation of chronic lower abdominal pain and rectal bleeding. He was no show for 11/2011 appointment. States he did not have a ride. Stabbing pain in abdomen off/on for one year. Not really related to meals. May happen while watching TV. Bends over with severe pain. Wakes up at night. BM are usually hard and associated with rectal pain. BM lately more every 2-3 days. Noticed brbpr in stool for one year. No vomiting. Bad heartburn takes TUMS, several days per week. Chronic nausea. Nocturnal heartburn. No dysphagia. C/O nonexertional chest pain, sometimes fleeting but at other times makes him cry and causes SOB. Last for 15 minutes or so. No prior w/u.   Current Outpatient Prescriptions  Medication Sig Dispense Refill  . albuterol (PROVENTIL HFA;VENTOLIN HFA) 108 (90 BASE) MCG/ACT inhaler Inhale 2 puffs into the lungs every 6 (six) hours as needed.      . DULoxetine (CYMBALTA) 60 MG capsule Take 60 mg by mouth daily.      Marland Kitchen gabapentin (NEURONTIN) 300 MG capsule Take 300 mg by mouth 3 (three) times daily.      Marland Kitchen oxyCODONE-acetaminophen (PERCOCET) 5-325 MG per tablet Take 1 tablet by mouth every 12 (twelve) hours as needed.        Allergies as of 02/25/2012  . (No Known Allergies)   Past Medical History  Diagnosis Date  . Lumbago   . Chronic bronchitis      Past Surgical History  Procedure Date  . Back surgery 11/22/1998  . Finger surgery    Family History  Problem Relation Age of Onset  . Colon cancer Father     age? died in prison from colon cancer  . Breast cancer Mother     deceased    History   Social History  . Marital Status: Single    Spouse Name: N/A    Number of Children: 0  . Years of Education: N/A    Occupational History  . unemployed    Social History Main Topics  . Smoking status: Current Everyday Smoker  . Smokeless tobacco: None  . Alcohol Use: No  . Drug Use: No  . Sexually Active:    Other Topics Concern  . None   Social History Narrative   Grew up in orphanage.Multiple siblings but relationship with only one sister.       ROS:  General: Negative for anorexia, weight loss, fever, chills, fatigue, weakness. Eyes: Negative for vision changes.  ENT: Negative for hoarseness, difficulty swallowing , nasal congestion. CV: see hpi. Negative for palpitations, dyspnea on exertion, peripheral edema.  Respiratory: Negative for dyspnea on exertion. See HPI. Uses inhaler during hot months.  GI: See history of present illness. GU:  Negative for dysuria, hematuria, urinary incontinence, urinary frequency, nocturnal urination.  MS: Negative for joint pain. chronic low back pain.  Derm: Negative for rash or itching.  Neuro: Negative for weakness, abnormal sensation, seizure, frequent headaches, memory loss, confusion.  Psych: Negative for anxiety, depression, suicidal ideation, hallucinations.  Endo: Negative for unusual weight change.  Heme: Negative for bruising or bleeding. Allergy: Negative for rash or hives.    Physical Examination:  BP 135/84  Pulse 117  Temp(Src) 98.4 F (36.9 C) (Temporal)  Ht 5\' 9"  (1.753 m)  Wt 165 lb (74.844 kg)  BMI 24.37 kg/m2   General: Well-nourished, well-developed in no acute distress.  Head: Normocephalic, atraumatic.   Eyes: Conjunctiva pink, no icterus. Mouth: Oropharyngeal mucosa moist and pink , no lesions erythema or exudate. Poor dentition Neck: Supple without thyromegaly, masses, or lymphadenopathy.  Lungs: Clear to auscultation bilaterally.  Heart: Regular rate and rhythm, no murmurs rubs or gallops.  Abdomen: Bowel sounds are normal, mild lower and epig tenderness, nondistended, no hepatosplenomegaly or masses, no abdominal  bruits or    hernia , no rebound or guarding.   Rectal: defer Extremities: No lower extremity edema. No clubbing or deformities.  Neuro: Alert and oriented x 4 , grossly normal neurologically.  Skin: Warm and dry, no rash or jaundice.   Psych: Alert and cooperative, normal mood and affect.  Labs: Labs from January 2013 Total bilirubin 0.2, alkaline phosphatase 77, AST 18, ALT 9, albumin 4.2, white blood cell count 11,500, hemoglobin 15.3, hematocrit 47, MCV 94.5, platelets 286,000.  Imaging Studies: No results found.

## 2012-03-24 NOTE — Op Note (Signed)
[facility] [address] [place], [place]  [address]  COLONOSCOPY PROCEDURE REPORT  PATIENT:  Joel Castillo, Joel Castillo  MR#:  [id number] BIRTHDATE:  June 16, 1961, 50 yrs. old  GENDER:  male ENDOSCOPIST:  R. Roetta Sessions, MD FACP [facility] REF. BY:  Dixie Dials, FNP PROCEDURE DATE:  03/24/2012 PROCEDURE:  Colonoscopy with multiple snare polypectomies  INDICATIONS:  Rectal bleeding; positive family history of colon cancer  INFORMED CONSENT:  The risks, benefits, alternatives and imponderables including but not limited to bleeding, perforation as well as the possibility of a missed lesion have been reviewed. The potential for biopsy, lesion removal, etc. have also been discussed.  Questions have been answered.  All parties agreeable. Please see the history and physical in the medical record for more information.  MEDICATIONS:  Demerol 50 mg IV and Versed 3 mg IV in divided doses.  DESCRIPTION OF PROCEDURE:  After a digital rectal exam was performed, the EC-3890Li ([id number]) colonoscope was advanced from the anus through the rectum and colon to the area of the cecum, ileocecal valve and appendiceal orifice.  The cecum was deeply intubated.  These structures were well-seen and photographed for the record.  From the level of the cecum and ileocecal valve, the scope was slowly and cautiously withdrawn.  The mucosal surfaces were carefully surveyed utilizing scope tip deflection to facilitate fold flattening as needed.  The scope was pulled down into the rectum where a thorough examination including retroflexion was performed. <<PROCEDUREIMAGES>>  FINDINGS: Poor/marginal preparation hampered the examination. The patient had multiple rectal and colonic polyps in the colon.patient had (2) 4 mm distal rectal polyps. The patient ha a large 1.5 cm polyp in  the mid sigmoid with numerous 5-7 mm polyps in the same region. Patient had multiple 5-7 mm polyps of the hepatic flexure as well.  The  patient had a very marginal prep. Small polyps may have been left behind.  In fact, I believe there were a couple of polyps in the left colon which I could not be reached because of the poor preparation.  THERAPEUTIC / DIAGNOSTIC MANEUVERS PERFORMED:   Multiple hot snare polypectomies performed.  COMPLICATIONS:  None  CECAL WITHDRAWAL TIME: 90 minutes  IMPRESSION:  Multiple rectal and colonic polyps status post multiple hot snare polypectomies. Sigmoid diverticulosis. Poor preparation.  RECOMMENDATIONS:   Followup on pathology. Assuming no high-grade lesions, this patient will need to come back in 3 months for an early                                                followup surveillance colonoscopy in the setting of a good preparation. ______________________________ R. Roetta Sessions, MD Caleen Essex  CC:  Dixie Dials, FNP  n. eSIGNED:   R. Roetta Sessions at 03/24/2012 11:02 AM  Erenest Rasher, [phone]

## 2012-03-27 ENCOUNTER — Encounter: Payer: Self-pay | Admitting: Internal Medicine

## 2012-03-30 NOTE — Progress Notes (Unsigned)
Pt aware. rx called to [facility].  Darl Pikes please nic tcs in 3 months with extra prep and extra time. Thanks.    Letter from: Corbin Ade Reason for Letter: Results Review Send letter to patient.  Send copy of letter with path to referring provider and PCP.  needs tcs 3 months with extra prep; schedule extra time. To Raynelle Fanning: Needs prepak x 14 days - hold prilosec and then resume

## 2012-04-04 NOTE — Progress Notes (Signed)
Reminder in epic to have tcs in 3 months with extra prep

## 2012-06-14 ENCOUNTER — Encounter: Payer: Self-pay | Admitting: *Deleted

## 2012-06-27 ENCOUNTER — Ambulatory Visit: Payer: Medicaid Other | Admitting: Gastroenterology

## 2012-07-08 ENCOUNTER — Ambulatory Visit: Payer: Medicaid Other | Admitting: Internal Medicine

## 2012-07-08 ENCOUNTER — Telehealth: Payer: Self-pay | Admitting: *Deleted

## 2012-07-08 NOTE — Telephone Encounter (Signed)
Pt was a no show

## 2012-11-21 ENCOUNTER — Emergency Department (HOSPITAL_COMMUNITY)
Admission: EM | Admit: 2012-11-21 | Discharge: 2012-11-21 | Disposition: A | Discharge status: Home / Self Care | Payer: Medicaid Other | Attending: Emergency Medicine | Admitting: Emergency Medicine

## 2012-11-21 ENCOUNTER — Emergency Department (HOSPITAL_COMMUNITY): Payer: Medicaid Other

## 2012-11-21 ENCOUNTER — Encounter (HOSPITAL_COMMUNITY): Payer: Self-pay | Admitting: *Deleted

## 2012-11-21 DIAGNOSIS — F329 Major depressive disorder, single episode, unspecified: Secondary | ICD-10-CM | POA: Insufficient documentation

## 2012-11-21 DIAGNOSIS — R059 Cough, unspecified: Secondary | ICD-10-CM | POA: Insufficient documentation

## 2012-11-21 DIAGNOSIS — Z8509 Personal history of malignant neoplasm of other digestive organs: Secondary | ICD-10-CM | POA: Insufficient documentation

## 2012-11-21 DIAGNOSIS — Z860100 Personal history of colon polyps, unspecified: Secondary | ICD-10-CM | POA: Insufficient documentation

## 2012-11-21 DIAGNOSIS — Z8719 Personal history of other diseases of the digestive system: Secondary | ICD-10-CM | POA: Insufficient documentation

## 2012-11-21 DIAGNOSIS — Z8619 Personal history of other infectious and parasitic diseases: Secondary | ICD-10-CM | POA: Insufficient documentation

## 2012-11-21 DIAGNOSIS — R197 Diarrhea, unspecified: Secondary | ICD-10-CM | POA: Insufficient documentation

## 2012-11-21 DIAGNOSIS — J449 Chronic obstructive pulmonary disease, unspecified: Secondary | ICD-10-CM | POA: Insufficient documentation

## 2012-11-21 DIAGNOSIS — R0789 Other chest pain: Secondary | ICD-10-CM | POA: Insufficient documentation

## 2012-11-21 DIAGNOSIS — R05 Cough: Secondary | ICD-10-CM | POA: Insufficient documentation

## 2012-11-21 DIAGNOSIS — I498 Other specified cardiac arrhythmias: Secondary | ICD-10-CM | POA: Insufficient documentation

## 2012-11-21 DIAGNOSIS — J4489 Other specified chronic obstructive pulmonary disease: Secondary | ICD-10-CM | POA: Insufficient documentation

## 2012-11-21 DIAGNOSIS — F17200 Nicotine dependence, unspecified, uncomplicated: Secondary | ICD-10-CM | POA: Insufficient documentation

## 2012-11-21 DIAGNOSIS — K219 Gastro-esophageal reflux disease without esophagitis: Secondary | ICD-10-CM | POA: Insufficient documentation

## 2012-11-21 DIAGNOSIS — F3289 Other specified depressive episodes: Secondary | ICD-10-CM | POA: Insufficient documentation

## 2012-11-21 DIAGNOSIS — G8929 Other chronic pain: Secondary | ICD-10-CM | POA: Insufficient documentation

## 2012-11-21 DIAGNOSIS — R000 Tachycardia, unspecified: Secondary | ICD-10-CM

## 2012-11-21 DIAGNOSIS — Z79899 Other long term (current) drug therapy: Secondary | ICD-10-CM | POA: Insufficient documentation

## 2012-11-21 LAB — RAPID URINE DRUG SCREEN, HOSP PERFORMED
Amphetamines: NOT DETECTED
Barbiturates: NOT DETECTED
Benzodiazepines: NOT DETECTED
Cocaine: NOT DETECTED
Opiates: NOT DETECTED
Tetrahydrocannabinol: NOT DETECTED

## 2012-11-21 LAB — URINALYSIS, ROUTINE W REFLEX MICROSCOPIC
Bilirubin Urine: NEGATIVE
Glucose, UA: NEGATIVE mg/dL
Hgb urine dipstick: NEGATIVE
Ketones, ur: NEGATIVE mg/dL
Leukocytes, UA: NEGATIVE
Nitrite: NEGATIVE
Protein, ur: NEGATIVE mg/dL
Specific Gravity, Urine: 1.01 (ref 1.005–1.030)
Urobilinogen, UA: 0.2 mg/dL (ref 0.0–1.0)
pH: 6 (ref 5.0–8.0)

## 2012-11-21 LAB — COMPREHENSIVE METABOLIC PANEL
ALT: 19 U/L (ref 0–53)
AST: 22 U/L (ref 0–37)
Albumin: 4.2 g/dL (ref 3.5–5.2)
Alkaline Phosphatase: 79 U/L (ref 39–117)
BUN: 7 mg/dL (ref 6–23)
CO2: 23 mEq/L (ref 19–32)
Calcium: 9.7 mg/dL (ref 8.4–10.5)
Chloride: 102 mEq/L (ref 96–112)
Creatinine, Ser: 1.03 mg/dL (ref 0.50–1.35)
GFR calc Af Amer: >90 mL/min (ref >90)
GFR calc non Af Amer: 83 mL/min — ABNORMAL LOW (ref >90)
Glucose, Bld: 108 mg/dL — ABNORMAL HIGH (ref 70–99)
Potassium: 3.6 mEq/L (ref 3.5–5.1)
Sodium: 138 mEq/L (ref 135–145)
Total Bilirubin: 0.3 mg/dL (ref 0.3–1.2)
Total Protein: 7.7 g/dL (ref 6.0–8.3)

## 2012-11-21 LAB — CBC WITH DIFFERENTIAL/PLATELET
Basophils Absolute: 0 10*3/uL (ref 0.0–0.1)
Basophils Relative: 0 % (ref 0–1)
Eosinophils Absolute: 0.2 10*3/uL (ref 0.0–0.7)
Eosinophils Relative: 1 % (ref 0–5)
HCT: 47.8 % (ref 39.0–52.0)
Hemoglobin: 17.1 g/dL — ABNORMAL HIGH (ref 13.0–17.0)
Lymphocytes Relative: 20 % (ref 12–46)
Lymphs Abs: 3.9 10*3/uL (ref 0.7–4.0)
MCH: 32.7 pg (ref 26.0–34.0)
MCHC: 35.8 g/dL (ref 30.0–36.0)
MCV: 91.4 fL (ref 78.0–100.0)
Monocytes Absolute: 1 10*3/uL (ref 0.1–1.0)
Monocytes Relative: 5 % (ref 3–12)
Neutro Abs: 14.2 10*3/uL — ABNORMAL HIGH (ref 1.7–7.7)
Neutrophils Relative %: 74 % (ref 43–77)
Platelets: 317 10*3/uL (ref 150–400)
RBC: 5.23 MIL/uL (ref 4.22–5.81)
RDW: 14.2 % (ref 11.5–15.5)
WBC: 19.3 10*3/uL — ABNORMAL HIGH (ref 4.0–10.5)

## 2012-11-21 LAB — D-DIMER, QUANTITATIVE: D-Dimer, Quant: 0.38 ug/mL-FEU (ref 0.00–0.48)

## 2012-11-21 LAB — TROPONIN I
Troponin I: <0.3 ng/mL (ref <0.30)
Troponin I: <0.3 ng/mL (ref <0.30)

## 2012-11-21 IMAGING — CR DG CHEST 2V
2 series · 2 of 2 positions shown · non-contrast
Comparison: Prior chest x-ray [DATE]

CLINICAL DATA: Tachycardia

CHEST - 2 VIEW

[view not recorded (1 of 2)]
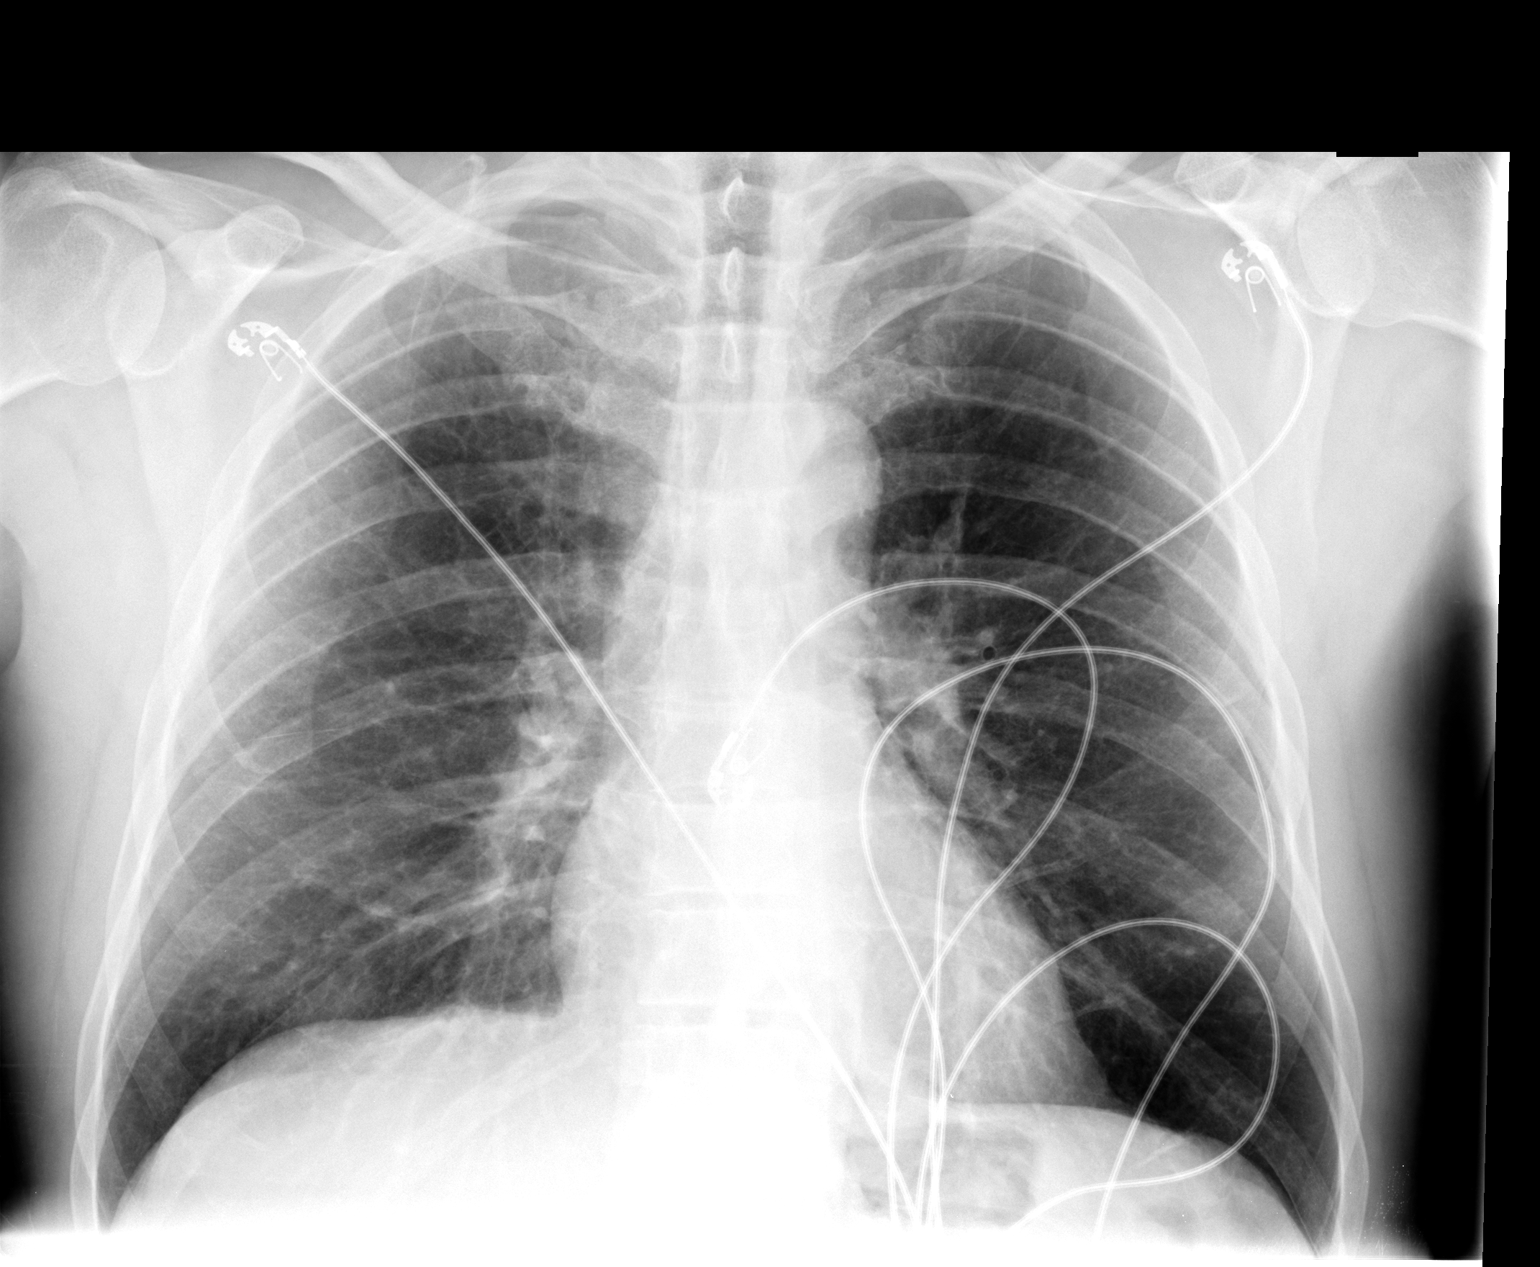

[view not recorded (2 of 2)]
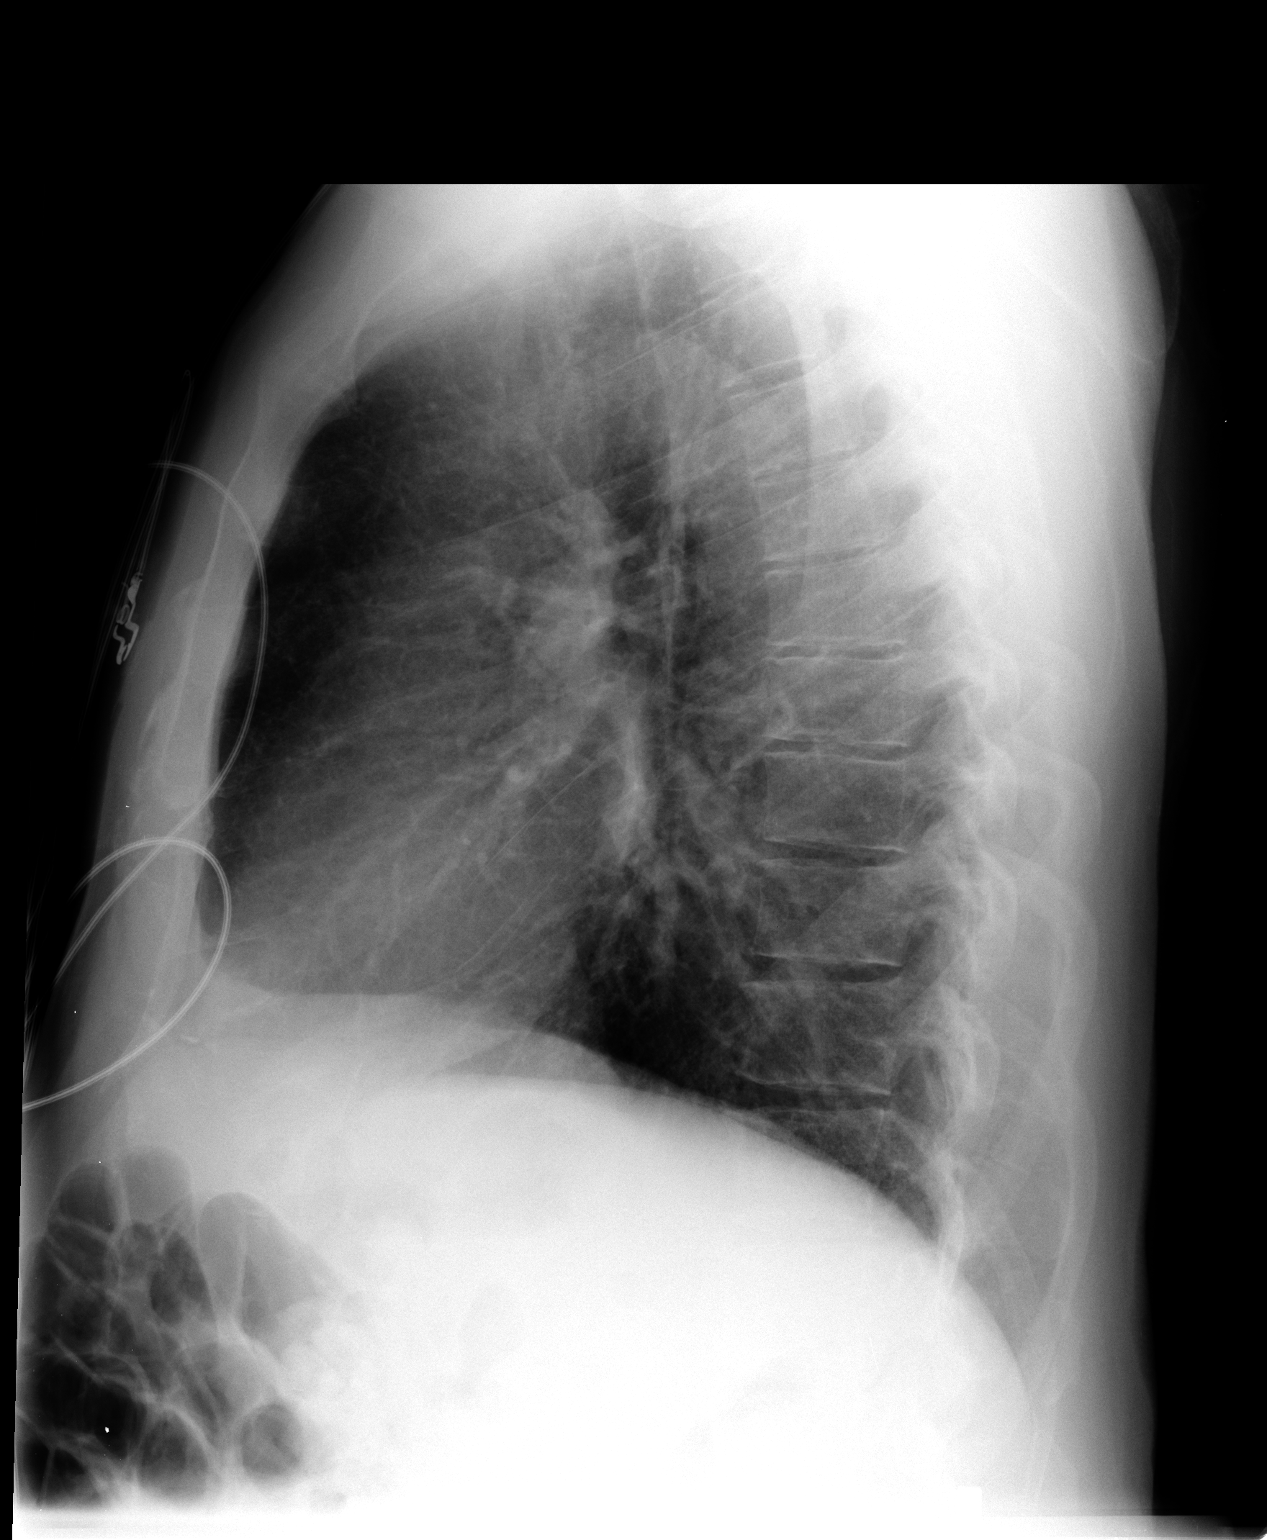

[2 of 2 positions shown; findings below may reference images not displayed]

FINDINGS: [The lungs are well-aerated and free from pulmonary
edema, focal airspace consolidation or pulmonary nodule.  Cardiac
and mediastinal contours are within normal limits.  Mild bronchitic
changes.  No pneumothorax, or pleural effusion. No acute osseous
findings.]
IMPRESSION: No acute cardiopulmonary disease.

## 2012-11-21 MED ORDER — SODIUM CHLORIDE 0.9 % IV BOLUS (SEPSIS)
500.0000 mL | Freq: Once | INTRAVENOUS | Status: AC
  Administered 2012-11-21: 500 mL via INTRAVENOUS

## 2012-11-21 MED ORDER — LEVALBUTEROL HCL 1.25 MG/0.5ML IN NEBU
1.2500 mg | INHALATION_SOLUTION | Freq: Once | RESPIRATORY_TRACT | Status: AC
  Administered 2012-11-21: 1.25 mg via RESPIRATORY_TRACT
  Filled 2012-11-21: qty 0.5

## 2012-11-21 NOTE — ED Notes (Signed)
Pt does report several episodes of diarrhea a few days ago, however states that he is no longer having diarrhea.

## 2012-11-21 NOTE — ED Provider Notes (Signed)
History    This chart was scribed for Gilda Crease, MD by Gerlean Ren, ED Scribe. This patient was seen in room APA07/APA07 and the patient's care was started at 3:59 PM    CSN: [id number]  Arrival date & time 11/21/12  1536   First MD Initiated Contact with Patient 11/21/12 1556      Chief Complaint  Patient presents with  . Tachycardia    The history is provided by the patient. No language interpreter was used.  Joel Castillo is a 52 y.o. male with h/o COPD who presents to the Emergency Department sent from pain management doctor after reporting for routine monthly check and discovering elevated HR.  Pt had no complaints when presenting to pain management and was not experiencing any palpitations.  Pt states that his HR runs higher than normal, but is unsure of his usual HR values.  Pt reports waxing-and-waning mild sternal chest tightness that began yesterday and has since moved to left side chest.  Pt denies chest pain, dyspnea, abdominal pain.  Mild amounts of non-bloody diarrhea and mild cough productive of sputum.  No changes in medications.  Pt is a current everyday smoker but denies alcohol use.   Past Medical History  Diagnosis Date  . Lumbago   . Chronic bronchitis   . GERD (gastroesophageal reflux disease)   . Shortness of breath   . COPD (chronic obstructive pulmonary disease)   . Depression   . Chronic back pain   . H. pylori infection 03/2012    treated with prevpac  . Sigmoid diverticulosis   . Tubulovillous adenoma   . Hyperplastic colon polyp     Past Surgical History  Procedure Laterality Date  . Back surgery  11/22/1998  . Finger surgery    . Colonoscopy  03/24/12    Dr. Jena Gauss- multiple rectal and colonic polyps= tubulovillous adenoma, tular adenoma and hyperplastic polyps ,  sigmoid diverticulosis, poor prep  . Esophagogastroduodenoscopy  03/24/12    Dr. Jena Gauss- patulous EG junction, moderate hiatal hernia, abnormal gastric mucosa, duodenal erosions,  +hpylori    Family History  Problem Relation Age of Onset  . Colon cancer Father     age? died in prison from colon cancer  . Breast cancer Mother     deceased    History  Substance Use Topics  . Smoking status: Current Every Day Smoker -- 1.00 packs/day for 25 years  . Smokeless tobacco: Not on file  . Alcohol Use: No      Review of Systems  Respiratory: Positive for cough and chest tightness. Negative for shortness of breath and wheezing.   Cardiovascular: Negative for chest pain and palpitations.       Elevated HR  Gastrointestinal: Positive for diarrhea (minimal).  All other systems reviewed and are negative.    Allergies  Review of patient's allergies indicates no known allergies.  Home Medications   Current Outpatient Rx  Name  Route  Sig  Dispense  Refill  . albuterol (PROVENTIL HFA;VENTOLIN HFA) 108 (90 BASE) MCG/ACT inhaler   Inhalation   Inhale 2 puffs into the lungs every 6 (six) hours as needed.         . DULoxetine (CYMBALTA) 60 MG capsule   Oral   Take 60 mg by mouth daily.         Marland Kitchen gabapentin (NEURONTIN) 300 MG capsule   Oral   Take 300 mg by mouth 3 (three) times daily.         Marland Kitchen  omeprazole (PRILOSEC) 20 MG capsule   Oral   Take 1 capsule (20 mg total) by mouth daily.   30 capsule   11   . oxyCODONE-acetaminophen (PERCOCET) 5-325 MG per tablet   Oral   Take 1 tablet by mouth every 12 (twelve) hours as needed. For pain         . peg 3350 powder (MOVIPREP) 100 G SOLR   Oral   Take 1 kit (100 g total) by mouth once. As directed Please purchase 1 Fleets enema to use with the prep   1 kit   0     BP 149/104  Pulse 138  Temp(Src) 97.5 F (36.4 C) (Oral)  Resp 20  Ht 5\' 9"  (1.753 m)  Wt 186 lb (84.369 kg)  BMI 27.45 kg/m2  SpO2 100%  Physical Exam  Nursing note and vitals reviewed. Constitutional: He is oriented to person, place, and time. He appears well-developed and well-nourished.  HENT:  Head: Normocephalic and  atraumatic.  Eyes: Conjunctivae and EOM are normal.  Neck: Normal range of motion. Neck supple.  Cardiovascular: Regular rhythm, normal heart sounds and intact distal pulses.   No murmur heard. Tachycardic  Pulmonary/Chest: Effort normal and breath sounds normal. No respiratory distress. He has no wheezes.  Abdominal: Soft. Bowel sounds are normal. He exhibits no distension. There is no tenderness.  Musculoskeletal: Normal range of motion. He exhibits no edema.  Neurological: He is alert and oriented to person, place, and time. He has normal strength. No sensory deficit.  Skin: Skin is warm and dry.  Psychiatric: He has a normal mood and affect.    ED Course  Procedures (including critical care time) DIAGNOSTIC STUDIES: Oxygen Saturation is 100% on room air, normal by my interpretation.    COORDINATION OF CARE: 4:03 PM- Patient informed of clinical course including IV fluids, CBC, c-met, troponin, D-dimer, urinalysis, urine rapid drug screen, chest XR, understands medical decision-making process, and agrees with plan.  Results for orders placed during the hospital encounter of 11/21/12  CBC WITH DIFFERENTIAL      Result Value Range   WBC 19.3 (*) 4.0 - 10.5 K/uL   RBC 5.23  4.22 - 5.81 MIL/uL   Hemoglobin 17.1 (*) 13.0 - 17.0 g/dL   HCT 45.4  09.8 - 11.9 %   MCV 91.4  78.0 - 100.0 fL   MCH 32.7  26.0 - 34.0 pg   MCHC 35.8  30.0 - 36.0 g/dL   RDW 14.7  82.9 - 56.2 %   Platelets 317  150 - 400 K/uL   Neutrophils Relative 74  43 - 77 %   Lymphocytes Relative 20  12 - 46 %   Monocytes Relative 5  3 - 12 %   Eosinophils Relative 1  0 - 5 %   Basophils Relative 0  0 - 1 %   Neutro Abs 14.2 (*) 1.7 - 7.7 K/uL   Lymphs Abs 3.9  0.7 - 4.0 K/uL   Monocytes Absolute 1.0  0.1 - 1.0 K/uL   Eosinophils Absolute 0.2  0.0 - 0.7 K/uL   Basophils Absolute 0.0  0.0 - 0.1 K/uL   WBC Morphology ATYPICAL LYMPHOCYTES    COMPREHENSIVE METABOLIC PANEL      Result Value Range   Sodium 138  135 -  145 mEq/L   Potassium 3.6  3.5 - 5.1 mEq/L   Chloride 102  96 - 112 mEq/L   CO2 23  19 - 32 mEq/L  Glucose, Bld 108 (*) 70 - 99 mg/dL   BUN 7  6 - 23 mg/dL   Creatinine, Ser 1.61  0.50 - 1.35 mg/dL   Calcium 9.7  8.4 - 09.6 mg/dL   Total Protein 7.7  6.0 - 8.3 g/dL   Albumin 4.2  3.5 - 5.2 g/dL   AST 22  0 - 37 U/L   ALT 19  0 - 53 U/L   Alkaline Phosphatase 79  39 - 117 U/L   Total Bilirubin 0.3  0.3 - 1.2 mg/dL   GFR calc non Af Amer 83 (*) >90 mL/min   GFR calc Af Amer >90  >90 mL/min  TROPONIN I      Result Value Range   Troponin I <0.30  <0.30 ng/mL  D-DIMER, QUANTITATIVE      Result Value Range   D-Dimer, Quant 0.38  0.00 - 0.48 ug/mL-FEU  URINALYSIS, ROUTINE W REFLEX MICROSCOPIC      Result Value Range   Color, Urine YELLOW  YELLOW   APPearance CLEAR  CLEAR   Specific Gravity, Urine 1.010  1.005 - 1.030   pH 6.0  5.0 - 8.0   Glucose, UA NEGATIVE  NEGATIVE mg/dL   Hgb urine dipstick NEGATIVE  NEGATIVE   Bilirubin Urine NEGATIVE  NEGATIVE   Ketones, ur NEGATIVE  NEGATIVE mg/dL   Protein, ur NEGATIVE  NEGATIVE mg/dL   Urobilinogen, UA 0.2  0.0 - 1.0 mg/dL   Nitrite NEGATIVE  NEGATIVE   Leukocytes, UA NEGATIVE  NEGATIVE  URINE RAPID DRUG SCREEN (HOSP PERFORMED)      Result Value Range   Opiates NONE DETECTED  NONE DETECTED   Cocaine NONE DETECTED  NONE DETECTED   Benzodiazepines NONE DETECTED  NONE DETECTED   Amphetamines NONE DETECTED  NONE DETECTED   Tetrahydrocannabinol NONE DETECTED  NONE DETECTED   Barbiturates NONE DETECTED  NONE DETECTED  TROPONIN I      Result Value Range   Troponin I <0.30  <0.30 ng/mL     Dg Chest 2 View  11/21/2012  *RADIOLOGY REPORT*  Clinical Data: Tachycardia  CHEST - 2 VIEW  Comparison: Prior chest x-ray 08/19/2009  Findings: The lungs are well-aerated and free from pulmonary edema, focal airspace consolidation or pulmonary nodule.  Cardiac and mediastinal contours are within normal limits.  Mild bronchitic changes.  No  pneumothorax, or pleural effusion. No acute osseous findings.  IMPRESSION:  No acute cardiopulmonary disease.   Original Report Authenticated By: Malachy Moan, M.D.     Diagnosis: Tachycardia, resolved; COPD    MDM  Patient comes to the ER for evaluation of tachycardia. Patient was noted to be tachycardic when he saw his pain management Dr. earlier today. He reports that he has a history of high heart rate, but is not sure what is normal pulse radius. He is complaining of some tightness in his chest with shortness of breath. He has a history of COPD. Patient's shortness of breath and chest tightness resolved after a Xopenex treatment.  Patient did have a resting sinus tachycardia at arrival. This resolved with IV fluids. Heart rate was now in the 90s. He has not had any arrhythmia other than the initial sinus tachycardia which has resolved.  Because of the chest discomfort, a second cardiac troponin was ordered and was negative. His EKG was unremarkable as well. Patient will therefore be discharged, followup with his Dr. for further evaluation.  I personally performed the services described in this documentation, which was scribed in  my presence. The recorded information has been reviewed and is accurate.         Gilda Crease, MD 11/21/12 [phone]

## 2012-11-21 NOTE — ED Notes (Signed)
States he started having chest pain yesterday.  Pt states that he went to his pain doctor today and they discovered that he had a fast heart rate and sent him to the ER for evaluation.  States that he has had chest pain all of his life.  States that he does feel slightly short of breath.

## 2012-11-21 NOTE — ED Notes (Signed)
Pt alert & oriented x4, stable gait. Patient given discharge instructions, paperwork & prescription(s). Patient  instructed to stop at the registration desk to finish any additional paperwork. Patient verbalized understanding. Pt left department w/ no further questions. 

## 2012-11-21 NOTE — ED Notes (Signed)
RN at bedside

## 2012-11-21 NOTE — ED Notes (Signed)
Chest pain, with tachycardia since yesterday, sent by Dr Gerilyn Pilgrim from office.Joel Castillo

## 2012-11-21 NOTE — ED Notes (Signed)
Pt unable to provide urine sample at present

## 2012-11-21 NOTE — ED Notes (Signed)
MD at bedside. 

## 2012-11-21 NOTE — ED Notes (Signed)
Patient is comfortable at this time. 

## 2013-01-16 ENCOUNTER — Telehealth: Payer: Self-pay | Admitting: Cardiovascular Disease

## 2013-01-16 ENCOUNTER — Ambulatory Visit: Payer: Medicaid Other | Admitting: Cardiovascular Disease

## 2013-01-17 ENCOUNTER — Ambulatory Visit: Payer: Medicaid Other | Admitting: Cardiovascular Disease

## 2013-02-24 ENCOUNTER — Other Ambulatory Visit: Payer: Self-pay | Admitting: Gastroenterology

## 2013-02-24 NOTE — Telephone Encounter (Signed)
Patient no showed last fall. He is overdue for short interval f/u colonoscopy given his poor prep and polyps.  Please offer OV.  Refill PPI X 1.

## 2013-11-27 ENCOUNTER — Other Ambulatory Visit (HOSPITAL_COMMUNITY): Payer: Self-pay | Admitting: Nurse Practitioner

## 2013-12-08 ENCOUNTER — Ambulatory Visit (HOSPITAL_COMMUNITY)
Admission: RE | Admit: 2013-12-08 | Discharge: 2013-12-08 | Disposition: A | Discharge status: Home / Self Care | Payer: Medicaid Other | Source: Ambulatory Visit | Attending: Nurse Practitioner | Admitting: Nurse Practitioner

## 2013-12-08 ENCOUNTER — Other Ambulatory Visit (HOSPITAL_COMMUNITY): Payer: Self-pay | Admitting: Nurse Practitioner

## 2013-12-08 DIAGNOSIS — M5137 Other intervertebral disc degeneration, lumbosacral region: Secondary | ICD-10-CM | POA: Insufficient documentation

## 2013-12-08 DIAGNOSIS — M545 Low back pain, unspecified: Secondary | ICD-10-CM

## 2013-12-08 DIAGNOSIS — Z981 Arthrodesis status: Secondary | ICD-10-CM | POA: Insufficient documentation

## 2013-12-08 DIAGNOSIS — M51379 Other intervertebral disc degeneration, lumbosacral region without mention of lumbar back pain or lower extremity pain: Secondary | ICD-10-CM | POA: Insufficient documentation

## 2013-12-08 IMAGING — CR DG LUMBAR SPINE COMPLETE 4+V
5 series · 5 of 5 positions shown · non-contrast
Comparison: DG LUMBAR SPINE COMPLETE dated [DATE]

CLINICAL DATA: Chronic low back pain

EXAM:
LUMBAR SPINE - COMPLETE 4+ VIEW

[view not recorded (1 of 5)]
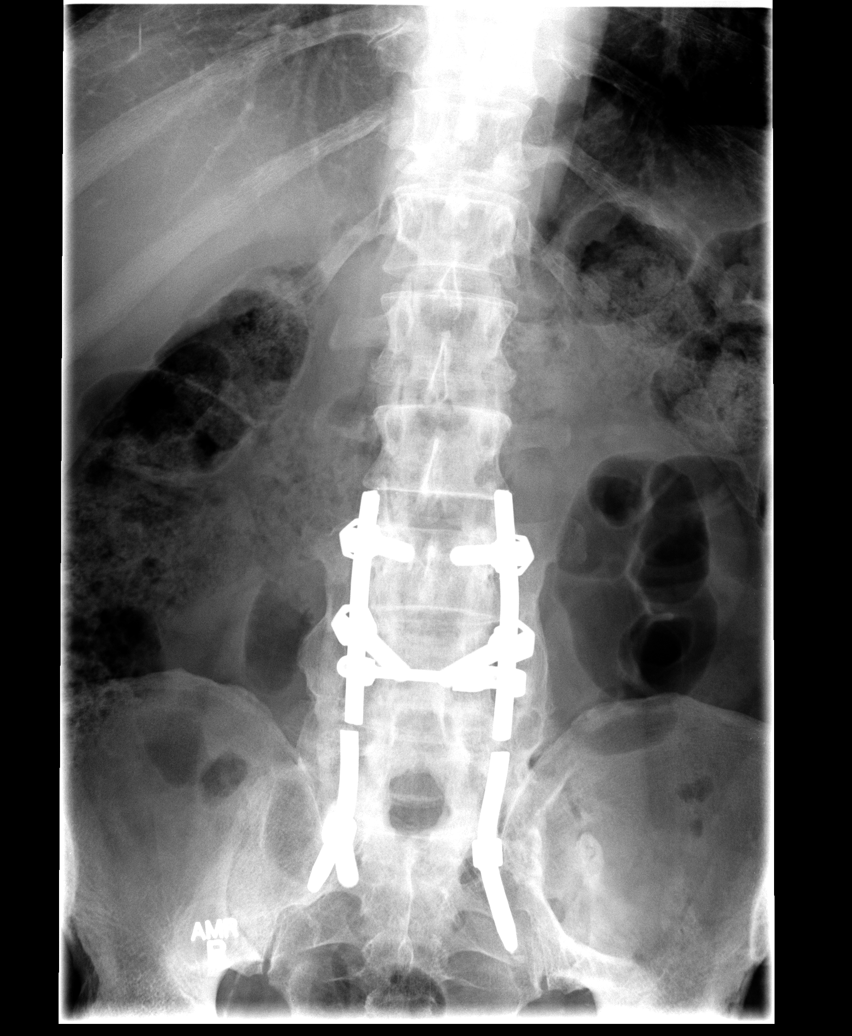

[view not recorded (2 of 5)]
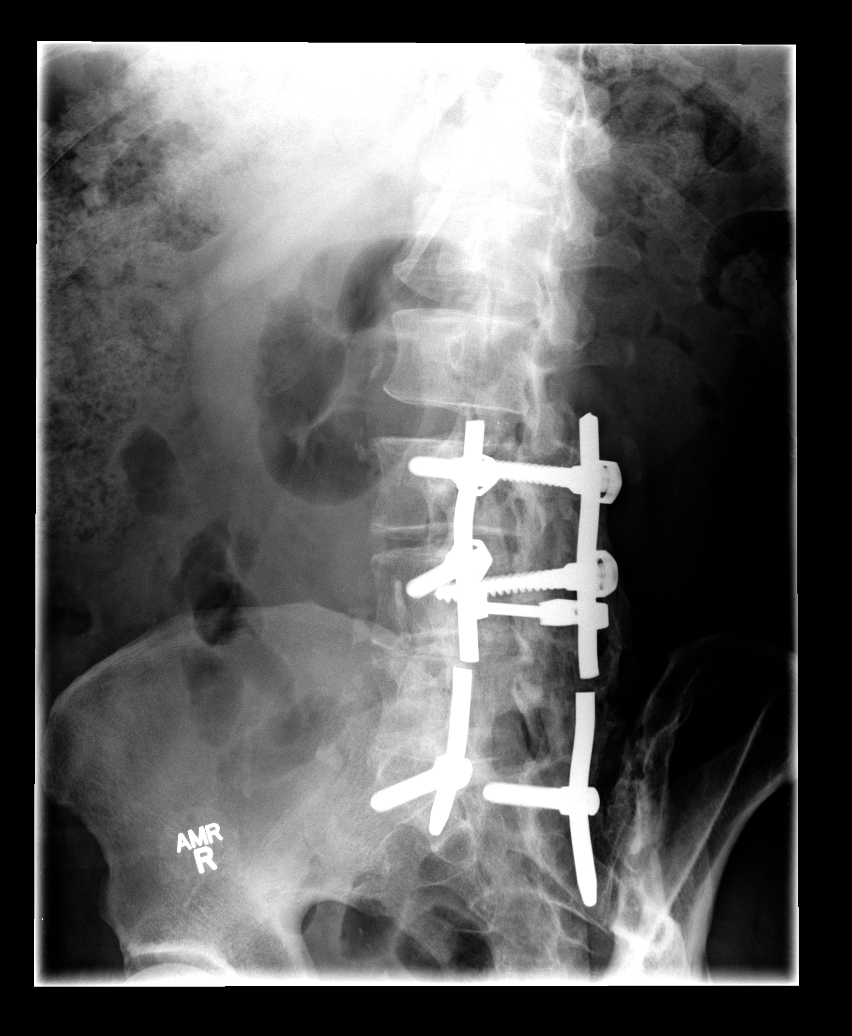

[view not recorded (3 of 5)]
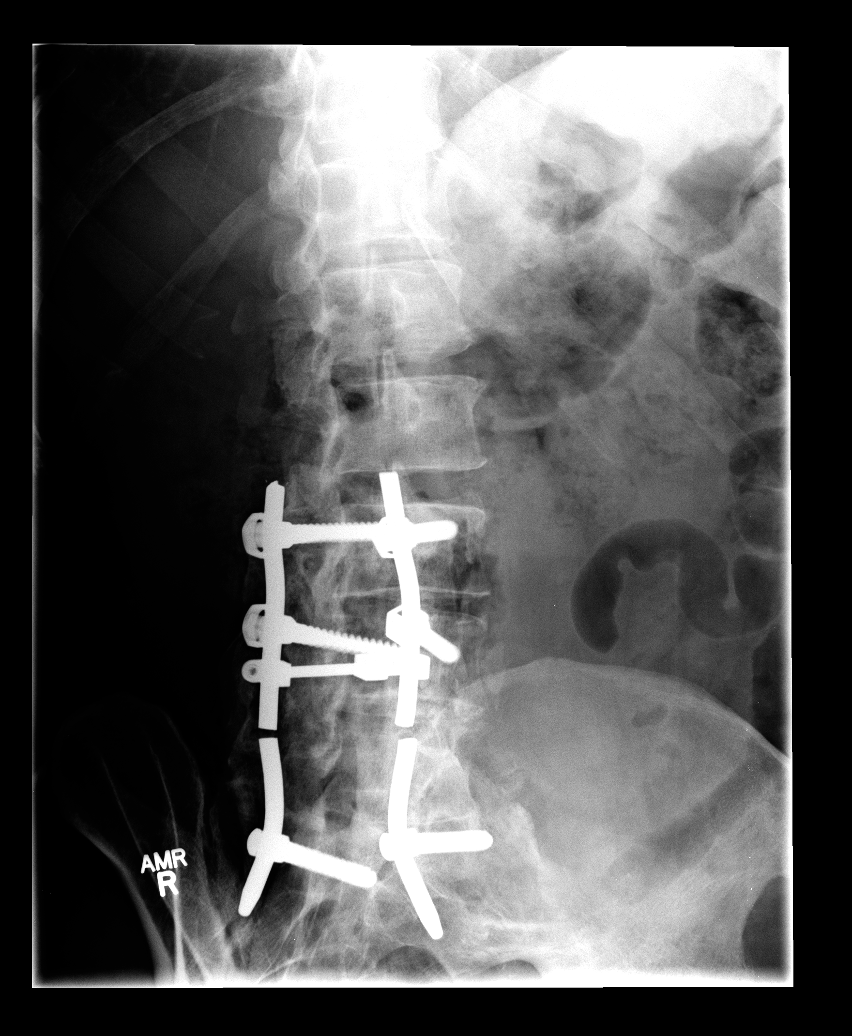

[view not recorded (4 of 5)]
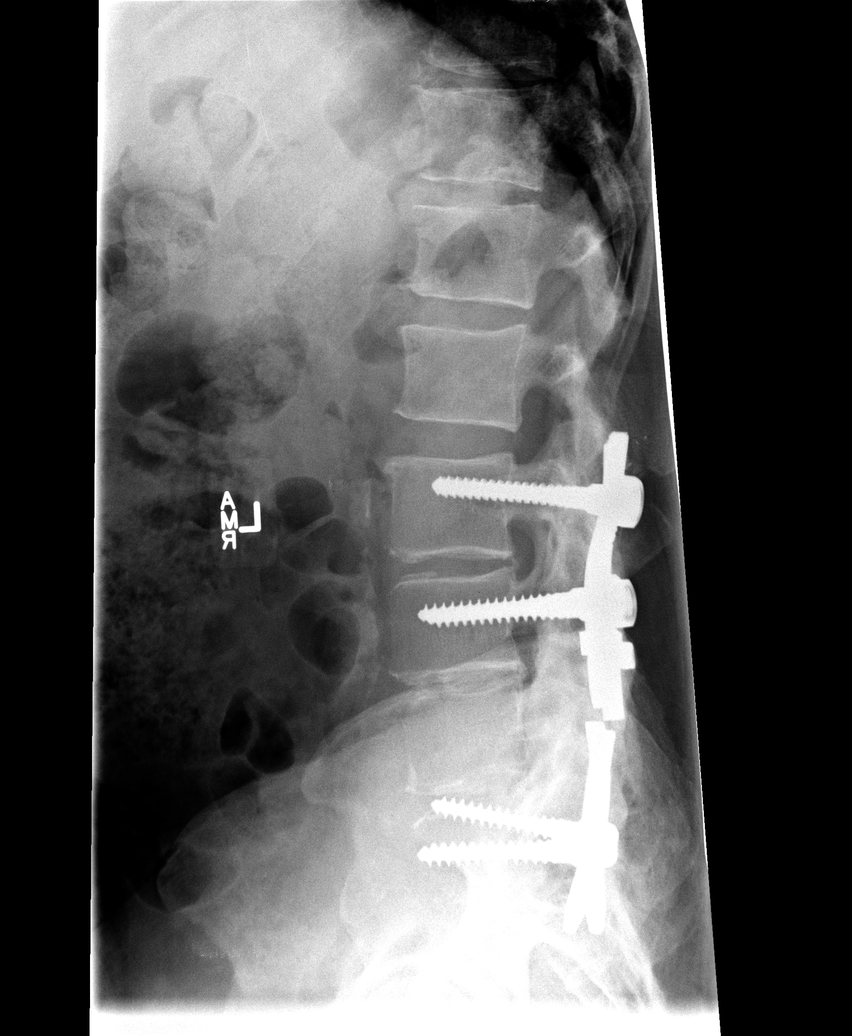

[view not recorded (5 of 5)]
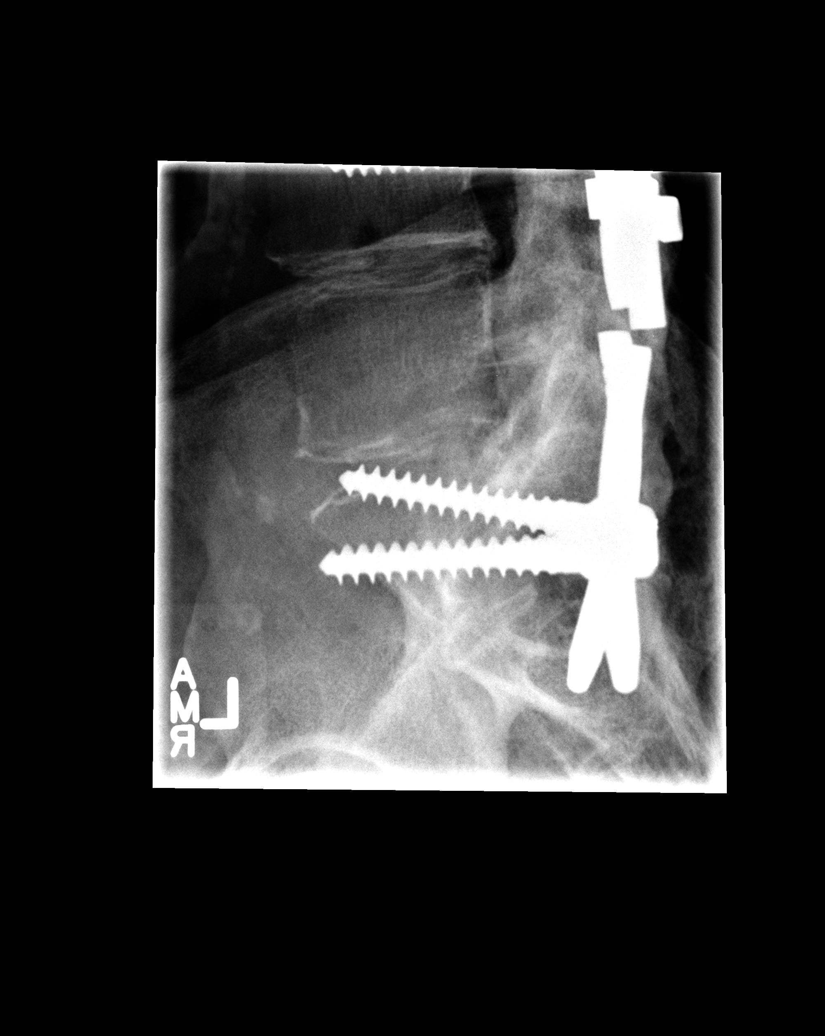

[5 of 5 positions shown; findings below may reference images not displayed]

FINDINGS: There are 5 nonrib bearing lumbar-type vertebral bodies. The
vertebral body heights are maintained. The alignment is anatomic.
There is no spondylolysis. There is no acute fracture or static
listhesis.

Posterior spinal fusion from L3 through S1 with bilateral pedicle
screws at L3, L4 and S1. There is a transverse fracture of the
vertical stabilizing rod bilaterally at the level of L5. There is
osseous fusion of the posterior elements from L3 through S1. There
is degenerative disc disease at L4-5 and L5-S1.

The SI joints are unremarkable.

There is abdominal aortic atherosclerosis.
IMPRESSION: Posterior spinal fusion from L3 through S1 with bilateral pedicle
screws at L3, L4 and S1 with a transverse fracture of the vertical
stabilizing rod bilaterally at the level of L5.

There is degenerative disc disease at L4-5 and L5-S1.

## 2015-04-21 ENCOUNTER — Encounter (HOSPITAL_COMMUNITY): Payer: Self-pay | Admitting: *Deleted

## 2015-04-21 ENCOUNTER — Emergency Department (HOSPITAL_COMMUNITY)
Admission: EM | Admit: 2015-04-21 | Discharge: 2015-04-22 | Disposition: A | Discharge status: Home / Self Care | Payer: Medicaid Other | Attending: Emergency Medicine | Admitting: Emergency Medicine

## 2015-04-21 ENCOUNTER — Emergency Department (HOSPITAL_COMMUNITY): Payer: Medicaid Other

## 2015-04-21 DIAGNOSIS — Z79899 Other long term (current) drug therapy: Secondary | ICD-10-CM | POA: Diagnosis not present

## 2015-04-21 DIAGNOSIS — Z8659 Personal history of other mental and behavioral disorders: Secondary | ICD-10-CM | POA: Diagnosis not present

## 2015-04-21 DIAGNOSIS — Z86018 Personal history of other benign neoplasm: Secondary | ICD-10-CM | POA: Diagnosis not present

## 2015-04-21 DIAGNOSIS — J449 Chronic obstructive pulmonary disease, unspecified: Secondary | ICD-10-CM | POA: Diagnosis not present

## 2015-04-21 DIAGNOSIS — Z791 Long term (current) use of non-steroidal anti-inflammatories (NSAID): Secondary | ICD-10-CM | POA: Insufficient documentation

## 2015-04-21 DIAGNOSIS — Z720 Tobacco use: Secondary | ICD-10-CM | POA: Diagnosis not present

## 2015-04-21 DIAGNOSIS — K219 Gastro-esophageal reflux disease without esophagitis: Secondary | ICD-10-CM | POA: Diagnosis not present

## 2015-04-21 DIAGNOSIS — R1032 Left lower quadrant pain: Secondary | ICD-10-CM | POA: Diagnosis present

## 2015-04-21 DIAGNOSIS — I10 Essential (primary) hypertension: Secondary | ICD-10-CM | POA: Diagnosis not present

## 2015-04-21 DIAGNOSIS — Z8619 Personal history of other infectious and parasitic diseases: Secondary | ICD-10-CM | POA: Diagnosis not present

## 2015-04-21 DIAGNOSIS — K5732 Diverticulitis of large intestine without perforation or abscess without bleeding: Secondary | ICD-10-CM

## 2015-04-21 DIAGNOSIS — R109 Unspecified abdominal pain: Secondary | ICD-10-CM

## 2015-04-21 DIAGNOSIS — Z86010 Personal history of colonic polyps: Secondary | ICD-10-CM | POA: Insufficient documentation

## 2015-04-21 DIAGNOSIS — G8929 Other chronic pain: Secondary | ICD-10-CM | POA: Insufficient documentation

## 2015-04-21 HISTORY — DX: Nausea: R11.0

## 2015-04-21 HISTORY — DX: Hemorrhage of anus and rectum: K62.5

## 2015-04-21 HISTORY — DX: Other chronic pain: G89.29

## 2015-04-21 HISTORY — DX: Unspecified abdominal pain: R10.9

## 2015-04-21 LAB — COMPREHENSIVE METABOLIC PANEL
ALT: 23 U/L (ref 17–63)
AST: 19 U/L (ref 15–41)
Albumin: 3.9 g/dL (ref 3.5–5.0)
Alkaline Phosphatase: 86 U/L (ref 38–126)
Anion gap: 11 (ref 5–15)
BUN: 10 mg/dL (ref 6–20)
CO2: 23 mmol/L (ref 22–32)
Calcium: 9.3 mg/dL (ref 8.9–10.3)
Chloride: 105 mmol/L (ref 101–111)
Creatinine, Ser: 1.07 mg/dL (ref 0.61–1.24)
GFR calc Af Amer: >60 mL/min (ref >60)
GFR calc non Af Amer: >60 mL/min (ref >60)
Glucose, Bld: 107 mg/dL — ABNORMAL HIGH (ref 65–99)
Potassium: 3.6 mmol/L (ref 3.5–5.1)
Sodium: 139 mmol/L (ref 135–145)
Total Bilirubin: 0.2 mg/dL — ABNORMAL LOW (ref 0.3–1.2)
Total Protein: 7.2 g/dL (ref 6.5–8.1)

## 2015-04-21 LAB — URINALYSIS, ROUTINE W REFLEX MICROSCOPIC
Bilirubin Urine: NEGATIVE
Glucose, UA: NEGATIVE mg/dL
Hgb urine dipstick: NEGATIVE
Ketones, ur: NEGATIVE mg/dL
Leukocytes, UA: NEGATIVE
Nitrite: NEGATIVE
Protein, ur: NEGATIVE mg/dL
Specific Gravity, Urine: <1.005 — ABNORMAL LOW (ref 1.005–1.030)
Urobilinogen, UA: 0.2 mg/dL (ref 0.0–1.0)
pH: 6 (ref 5.0–8.0)

## 2015-04-21 LAB — CBC WITH DIFFERENTIAL/PLATELET
Basophils Absolute: 0.1 10*3/uL (ref 0.0–0.1)
Basophils Relative: 0 % (ref 0–1)
Eosinophils Absolute: 0.2 10*3/uL (ref 0.0–0.7)
Eosinophils Relative: 1 % (ref 0–5)
HCT: 46.7 % (ref 39.0–52.0)
Hemoglobin: 15.9 g/dL (ref 13.0–17.0)
Lymphocytes Relative: 29 % (ref 12–46)
Lymphs Abs: 4.3 10*3/uL — ABNORMAL HIGH (ref 0.7–4.0)
MCH: 30.6 pg (ref 26.0–34.0)
MCHC: 34 g/dL (ref 30.0–36.0)
MCV: 89.8 fL (ref 78.0–100.0)
Monocytes Absolute: 1 10*3/uL (ref 0.1–1.0)
Monocytes Relative: 7 % (ref 3–12)
Neutro Abs: 9.2 10*3/uL — ABNORMAL HIGH (ref 1.7–7.7)
Neutrophils Relative %: 63 % (ref 43–77)
Platelets: 315 10*3/uL (ref 150–400)
RBC: 5.2 MIL/uL (ref 4.22–5.81)
RDW: 14 % (ref 11.5–15.5)
WBC: 14.8 10*3/uL — ABNORMAL HIGH (ref 4.0–10.5)

## 2015-04-21 LAB — LIPASE, BLOOD: Lipase: 30 U/L (ref 22–51)

## 2015-04-21 IMAGING — CT CT ABD-PELV W/ CM
2 of 5 series · 16 of 46 positions shown, 18 images · IV contrast (Omnipaque 300)
Comparison: CT dated [DATE]

CLINICAL DATA: 51-year-old male with left lower quadrant abdominal
pain and diarrhea

EXAM:
CT ABDOMEN AND PELVIS WITH CONTRAST
TECHNIQUE: Multidetector CT imaging of the abdomen and pelvis was performed
using the standard protocol following bolus administration of
intravenous contrast.
CONTRAST:  100mL OMNIPAQUE IOHEXOL 300 MG/ML SOLN, 25mL OMNIPAQUE
IOHEXOL 300 MG/ML SOLN

[Series 2: abd_pel_with 5.0 b40f · axial · 0.74mm/px · z∈[+662,+1096]mm · 13 of 99 slices shown, 15 images]
[im 6/99  soft-tissue]
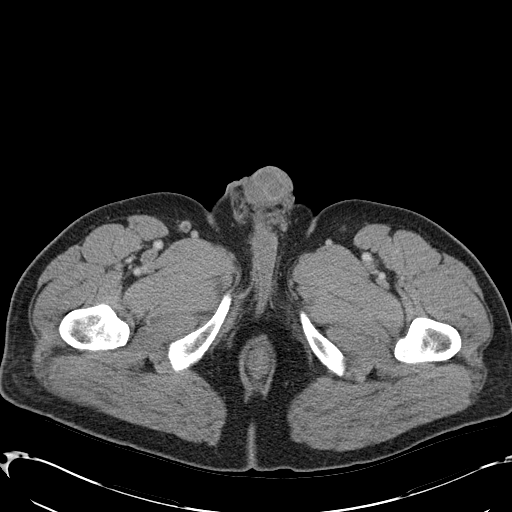
[im 6/99  bone]
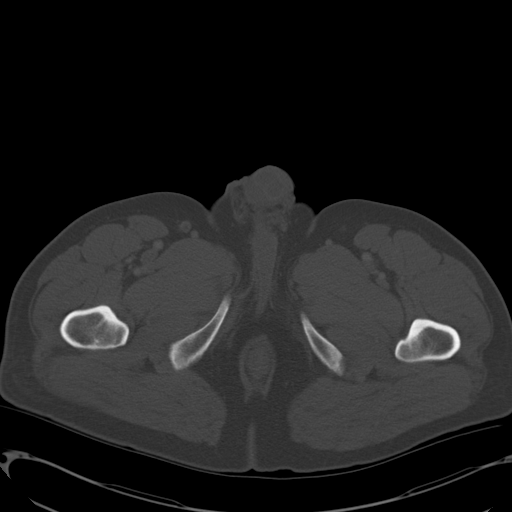
[im 11/99  soft-tissue]
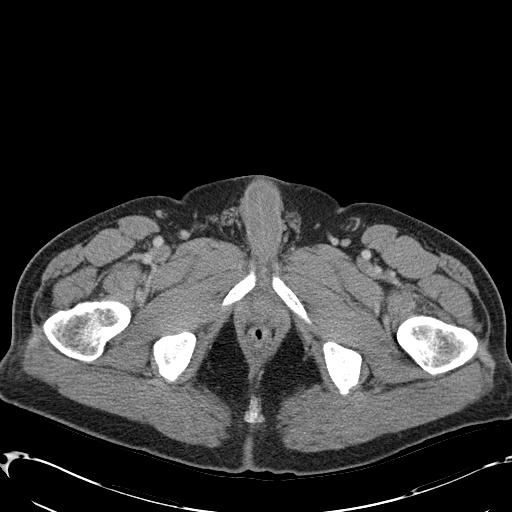
[im 22/99  soft-tissue]
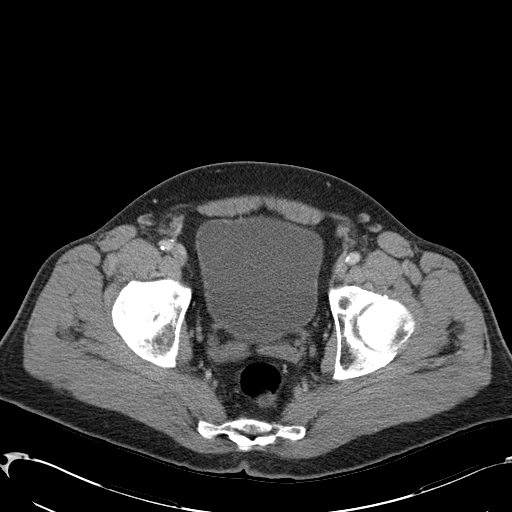
[im 28/99  soft-tissue]
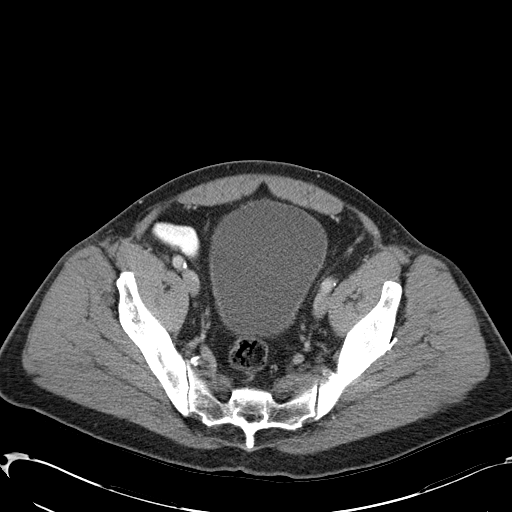
[im 33/99  soft-tissue]
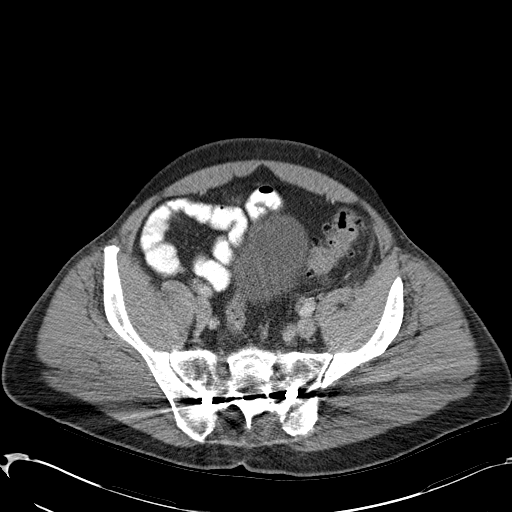
[im 44/99  soft-tissue]
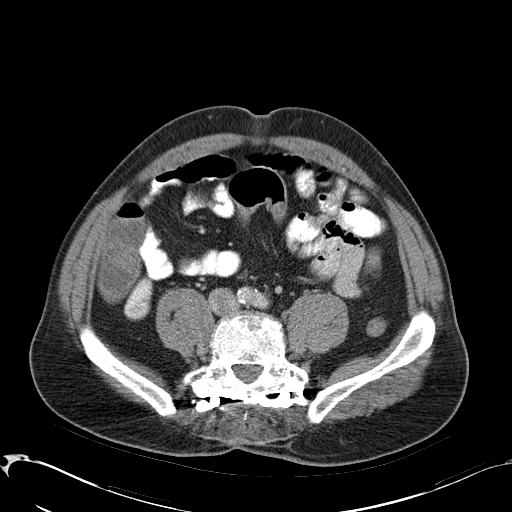
[im 50/99  soft-tissue]
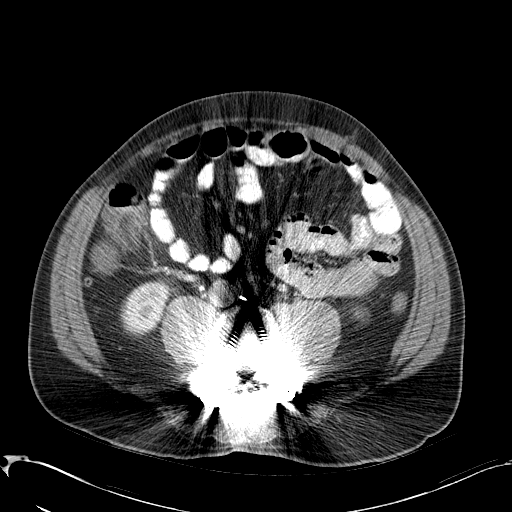
[im 55/99  soft-tissue]
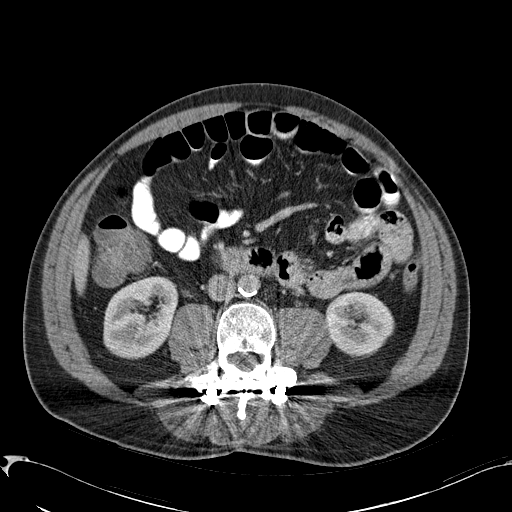
[im 66/99  soft-tissue]
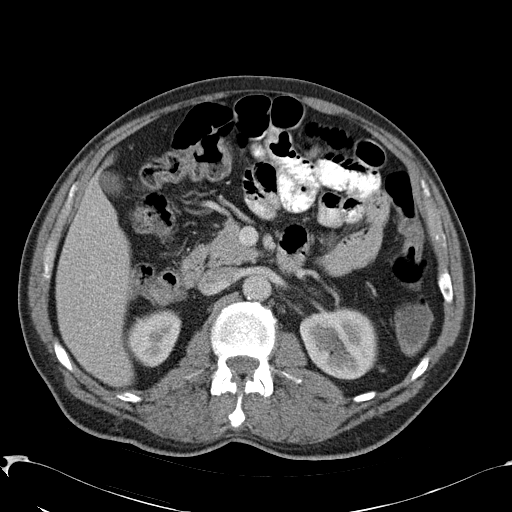
[im 66/99  bone]
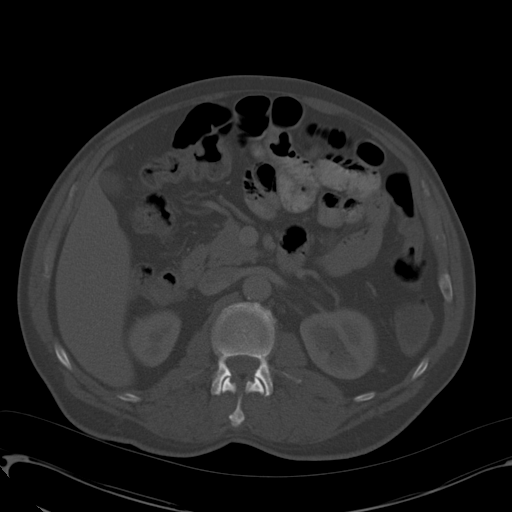
[im 71/99  soft-tissue]
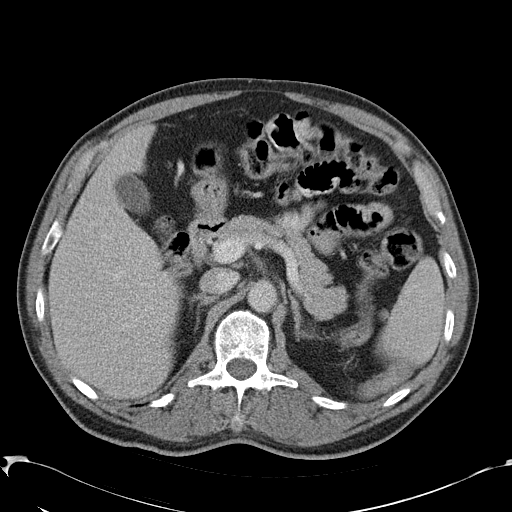
[im 77/99  soft-tissue]
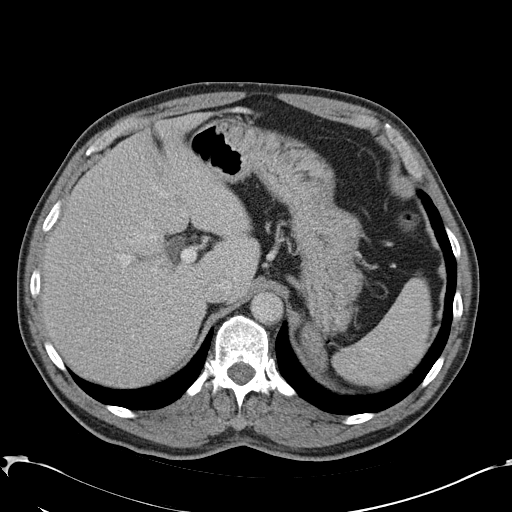
[im 88/99  soft-tissue]
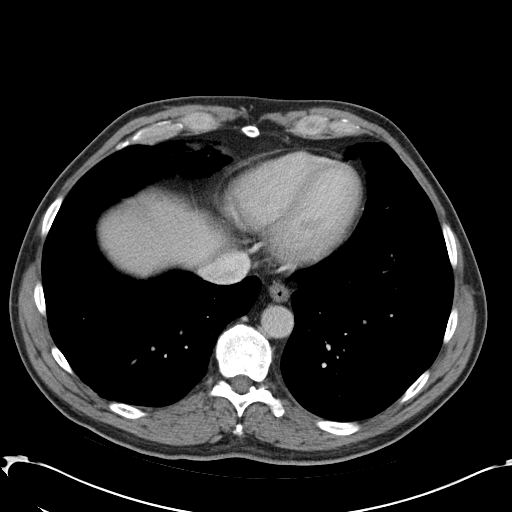
[im 93/99  soft-tissue]
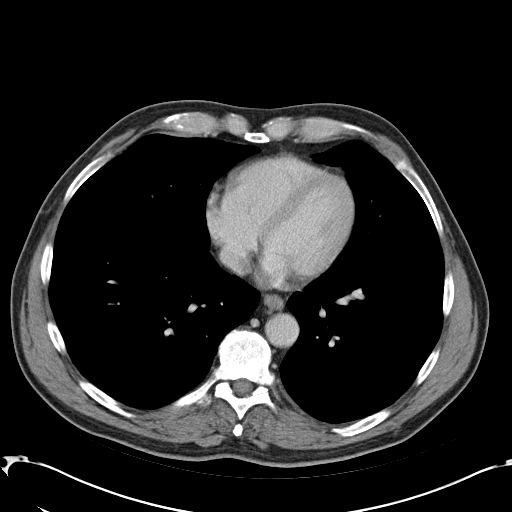

[Series 3: abd_pel_with 3.0 spo cor · coronal · 0.72mm/px · 3 of 100 slices shown]
[im 34/100  soft-tissue]
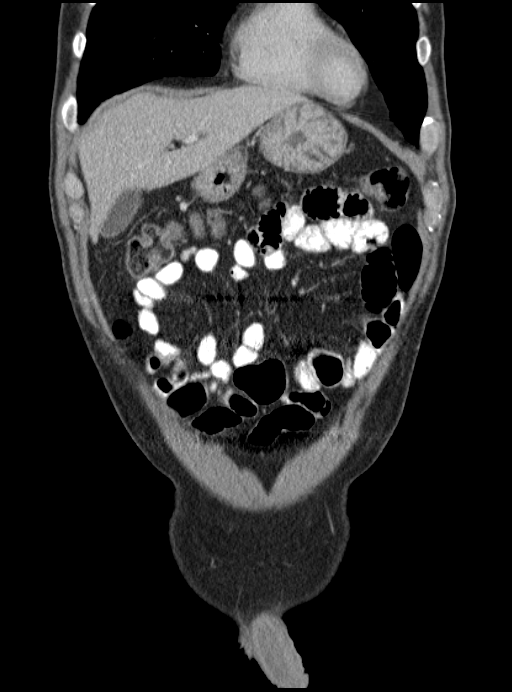
[im 45/100  soft-tissue]
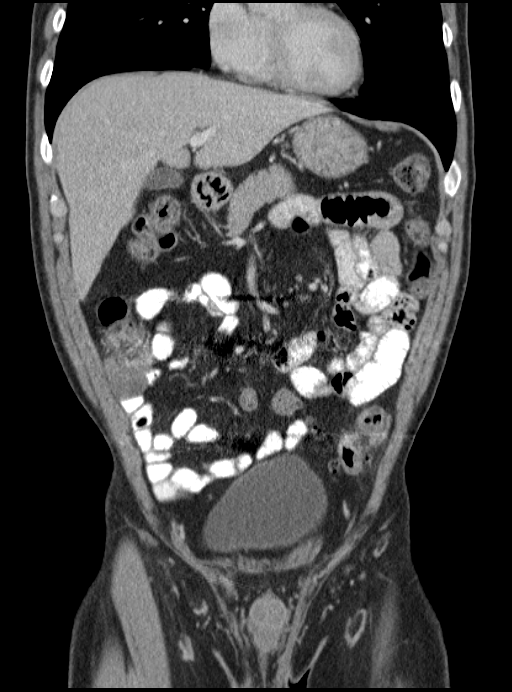
[im 56/100  soft-tissue]
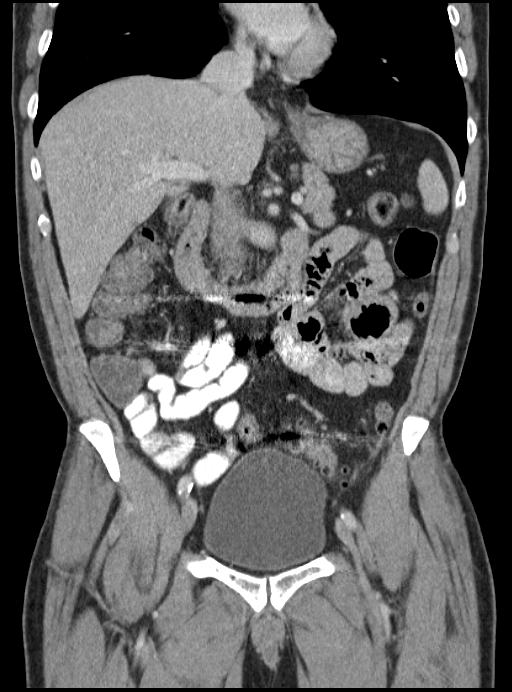

[16 of 46 positions shown; findings below may reference images not displayed]

FINDINGS: The visualized lung bases are clear. Intra-abdominal free air or
free fluid.

A subcentimeter left hepatic hypodense lesion is too small to
characterize but may represent a cyst or image 3 the liver is
otherwise unremarkable. No intrahepatic biliary ductal dilatation.
The gallbladder, pancreas, spleen, adrenal glands and kidneys,
visualized ureters, and urinary bladder appear unremarkable. The
prostate and seminal vesicles are grossly unremarkable.

There is sigmoid diverticulosis with muscular hypertrophy. There is
inflammatory changes of the sigmoid colon compatible with
diverticulitis. Moderate stool throughout the colon. There is no
evidence of bowel obstruction. Normal appendix. There is apparent
diffuse thickening of the gastric rugal fold, likely related to
underdistention.

There is aortoiliac atherosclerotic disease. The visualized
abdominal aorta and IVC are patent. No portal venous gas identified.
There is no lymphadenopathy. Small fat containing of her left there
is degenerative changes of the spine. L3-S1 posterior fixation rod
and screws noted. There is postsurgical changes with facet
hypertrophy. There are bilateral neural foraminal narrowing at L3-L4
and L4-5. L5-S1 laminectomy noted. No acute fracture
IMPRESSION: Sigmoid diverticulitis.  No abscess.

## 2015-04-21 MED ORDER — CIPROFLOXACIN IN D5W 400 MG/200ML IV SOLN
400.0000 mg | Freq: Once | INTRAVENOUS | Status: AC
  Administered 2015-04-22: 400 mg via INTRAVENOUS
  Filled 2015-04-21: qty 200

## 2015-04-21 MED ORDER — METRONIDAZOLE 500 MG PO TABS: 500.0000 mg | ORAL_TABLET | Freq: Four times a day (QID) | ORAL | Status: DC

## 2015-04-21 MED ORDER — MORPHINE SULFATE 4 MG/ML IJ SOLN
4.0000 mg | INTRAMUSCULAR | Status: DC | PRN
  Administered 2015-04-21: 4 mg via INTRAVENOUS
  Filled 2015-04-21: qty 1

## 2015-04-21 MED ORDER — METRONIDAZOLE IN NACL 5-0.79 MG/ML-% IV SOLN
500.0000 mg | Freq: Once | INTRAVENOUS | Status: AC
  Administered 2015-04-21: 500 mg via INTRAVENOUS
  Filled 2015-04-21: qty 100

## 2015-04-21 MED ORDER — OXYCODONE-ACETAMINOPHEN 5-325 MG PO TABS: 1.0000 | ORAL_TABLET | Freq: Four times a day (QID) | ORAL | Status: DC | PRN

## 2015-04-21 MED ORDER — SODIUM CHLORIDE 0.9 % IV SOLN
INTRAVENOUS | Status: DC
  Administered 2015-04-21: 20:00:00 via INTRAVENOUS

## 2015-04-21 MED ORDER — CIPROFLOXACIN HCL 500 MG PO TABS: 500.0000 mg | ORAL_TABLET | Freq: Two times a day (BID) | ORAL | Status: DC

## 2015-04-21 MED ORDER — IOHEXOL 300 MG/ML  SOLN
100.0000 mL | Freq: Once | INTRAMUSCULAR | Status: AC | PRN
  Administered 2015-04-21: 100 mL via INTRAVENOUS

## 2015-04-21 MED ORDER — PROMETHAZINE HCL 25 MG PO TABS: 25.0000 mg | ORAL_TABLET | Freq: Four times a day (QID) | ORAL | Status: DC | PRN

## 2015-04-21 MED ORDER — IOHEXOL 300 MG/ML  SOLN
25.0000 mL | Freq: Once | INTRAMUSCULAR | Status: AC | PRN
  Administered 2015-04-21: 25 mL via ORAL

## 2015-04-21 MED ORDER — ONDANSETRON HCL 4 MG/2ML IJ SOLN
4.0000 mg | INTRAMUSCULAR | Status: DC | PRN
  Administered 2015-04-21: 4 mg via INTRAVENOUS
  Filled 2015-04-21: qty 2

## 2015-04-21 NOTE — ED Notes (Signed)
Pt also reports that he has diarrhea after he eats for the past month,

## 2015-04-21 NOTE — ED Provider Notes (Signed)
CT scan shows sigmoid diverticulitis. Patient is stable. No acute abdomen. IV Cipro and Flagyl in the emergency department. Discharge home with oral Cipro 500 mg, Flagyl 500 mg, Percocet, Phenergan 25 mg. Patient understands to return if worse.  Nat Christen, MD 04/21/15 [phone]

## 2015-04-21 NOTE — ED Provider Notes (Signed)
CSN: [id number]     Arrival date & time 04/21/15  1941 History   First MD Initiated Contact with Patient 04/21/15 1945     Chief Complaint  Patient presents with  . Abdominal Pain      HPI Pt was seen at 1940.  Per pt, c/o gradual onset and persistence of constant acute flair of his chronic LLQ abd "pain" for the past 1 month.  Has been associated with multiple intermittent episodes of "diarrhea." Describes the stools as "watery" and "loose."   Describes the abd pain as "cramping." States his symptoms worsen after eating. Pt states he was evaluated by his PMD for same "but they just did blood work and told me I was ok." Pt did not f/u with his GI Dr. Gala Romney. Denies N/V, no fevers, no back pain, no rash, no CP/SOB, no black or blood in stools.       Past Medical History  Diagnosis Date  . Lumbago   . Chronic bronchitis   . GERD (gastroesophageal reflux disease)   . Shortness of breath   . COPD (chronic obstructive pulmonary disease)   . Depression   . H. pylori infection 03/2012    treated with prevpac  . Sigmoid diverticulosis   . Tubulovillous adenoma   . Hyperplastic colon polyp   . Chronic abdominal pain   . Rectal bleeding   . Chronic nausea   . Chronic back pain    Past Surgical History  Procedure Laterality Date  . Back surgery  11/22/1998  . Finger surgery    . Colonoscopy  03/24/12    Dr. Gala Romney- multiple rectal and colonic polyps= tubulovillous adenoma, tular adenoma and hyperplastic polyps ,  sigmoid diverticulosis, poor prep  . Esophagogastroduodenoscopy  03/24/12    Dr. Gala Romney- patulous EG junction, moderate hiatal hernia, abnormal gastric mucosa, duodenal erosions, +hpylori   Family History  Problem Relation Age of Onset  . Colon cancer Father     age? died in prison from colon cancer  . Breast cancer Mother     deceased   History  Substance Use Topics  . Smoking status: Current Every Day Smoker -- 1.00 packs/day for 25 years  . Smokeless tobacco: Not on file   . Alcohol Use: No    Review of Systems ROS: Statement: All systems negative except as marked or noted in the HPI; Constitutional: Negative for fever and chills. ; ; Eyes: Negative for eye pain, redness and discharge. ; ; ENMT: Negative for ear pain, hoarseness, nasal congestion, sinus pressure and sore throat. ; ; Cardiovascular: Negative for chest pain, palpitations, diaphoresis, dyspnea and peripheral edema. ; ; Respiratory: Negative for cough, wheezing and stridor. ; ; Gastrointestinal: +abd pain, diarrhea. Negative for nausea, vomiting, blood in stool, hematemesis, jaundice and rectal bleeding. . ; ; Genitourinary: Negative for dysuria, flank pain and hematuria. ; ; Musculoskeletal: Negative for back pain and neck pain. Negative for swelling and trauma.; ; Skin: Negative for pruritus, rash, abrasions, blisters, bruising and skin lesion.; ; Neuro: Negative for headache, lightheadedness and neck stiffness. Negative for weakness, altered level of consciousness , altered mental status, extremity weakness, paresthesias, involuntary movement, seizure and syncope.       Allergies  Review of patient's allergies indicates no known allergies.  Home Medications   Prior to Admission medications   Medication Sig Start Date End Date Taking? Authorizing Provider  albuterol (PROVENTIL HFA;VENTOLIN HFA) 108 (90 BASE) MCG/ACT inhaler Inhale 2 puffs into the lungs every 6 (six) hours  as needed.    Historical Provider, MD  cyclobenzaprine (FLEXERIL) 10 MG tablet Take 10 mg by mouth 3 (three) times daily as needed for muscle spasms.    Historical Provider, MD  diclofenac (VOLTAREN) 50 MG EC tablet Take 50 mg by mouth 2 (two) times daily.    Historical Provider, MD  gabapentin (NEURONTIN) 300 MG capsule Take 300 mg by mouth 3 (three) times daily.    Historical Provider, MD  lubiprostone (AMITIZA) 24 MCG capsule Take 24 mcg by mouth 2 (two) times daily with a meal.    Historical Provider, MD  omeprazole  (PRILOSEC) 20 MG capsule TAKE ONE CAPSULE BY MOUTH DAILY 02/24/13   Mahala Menghini, PA-C  oxyCODONE-acetaminophen (PERCOCET) 5-325 MG per tablet Take 1 tablet by mouth every 12 (twelve) hours as needed. For pain    Historical Provider, MD  traMADol (ULTRAM) 50 MG tablet Take 50-100 mg by mouth every 12 (twelve) hours as needed for pain.    Historical Provider, MD   BP 128/93 mmHg  Pulse 110  Temp(Src) 98.2 F (36.8 C) (Oral)  Resp 18  Ht 5\' 9"  (1.753 m)  Wt 180 lb (81.647 kg)  BMI 26.57 kg/m2  SpO2 99% Physical Exam  1945: Physical examination:  Nursing notes reviewed; Vital signs and O2 SAT reviewed;  Constitutional: Well developed, Well nourished, Well hydrated, In no acute distress; Head:  Normocephalic, atraumatic; Eyes: EOMI, PERRL, No scleral icterus; ENMT: Mouth and pharynx normal, Mucous membranes moist; Neck: Supple, Full range of motion, No lymphadenopathy; Cardiovascular: Regular rate and rhythm, No murmur, rub, or gallop; Respiratory: Breath sounds clear & equal bilaterally, No rales, rhonchi, wheezes.  Speaking full sentences with ease, Normal respiratory effort/excursion; Chest: Nontender, Movement normal; Abdomen: Soft, +LLQ tenderness to palp. No rebound or guarding. Nondistended, Normal bowel sounds; Genitourinary: No CVA tenderness; Extremities: Pulses normal, No tenderness, No edema, No calf edema or asymmetry.; Neuro: AA&Ox3, Major CN grossly intact.  Speech clear. No gross focal motor or sensory deficits in extremities.; Skin: Color normal, Warm, Dry.   ED Course  Procedures     EKG Interpretation None      MDM  MDM Reviewed: previous chart, nursing note and vitals Reviewed previous: labs Interpretation: labs      Results for orders placed or performed during the hospital encounter of 04/21/15  Comprehensive metabolic panel  Result Value Ref Range   Sodium 139 135 - 145 mmol/L   Potassium 3.6 3.5 - 5.1 mmol/L   Chloride 105 101 - 111 mmol/L   CO2 23 22 -  32 mmol/L   Glucose, Bld 107 (H) 65 - 99 mg/dL   BUN 10 6 - 20 mg/dL   Creatinine, Ser 1.07 0.61 - 1.24 mg/dL   Calcium 9.3 8.9 - 10.3 mg/dL   Total Protein 7.2 6.5 - 8.1 g/dL   Albumin 3.9 3.5 - 5.0 g/dL   AST 19 15 - 41 U/L   ALT 23 17 - 63 U/L   Alkaline Phosphatase 86 38 - 126 U/L   Total Bilirubin 0.2 (L) 0.3 - 1.2 mg/dL   GFR calc non Af Amer >60 >60 mL/min   GFR calc Af Amer >60 >60 mL/min   Anion gap 11 5 - 15  Lipase, blood  Result Value Ref Range   Lipase 30 22 - 51 U/L  CBC with Differential  Result Value Ref Range   WBC 14.8 (H) 4.0 - 10.5 K/uL   RBC 5.20 4.22 - 5.81 MIL/uL  Hemoglobin 15.9 13.0 - 17.0 g/dL   HCT 46.7 39.0 - 52.0 %   MCV 89.8 78.0 - 100.0 fL   MCH 30.6 26.0 - 34.0 pg   MCHC 34.0 30.0 - 36.0 g/dL   RDW 14.0 11.5 - 15.5 %   Platelets 315 150 - 400 K/uL   Neutrophils Relative % 63 43 - 77 %   Neutro Abs 9.2 (H) 1.7 - 7.7 K/uL   Lymphocytes Relative 29 12 - 46 %   Lymphs Abs 4.3 (H) 0.7 - 4.0 K/uL   Monocytes Relative 7 3 - 12 %   Monocytes Absolute 1.0 0.1 - 1.0 K/uL   Eosinophils Relative 1 0 - 5 %   Eosinophils Absolute 0.2 0.0 - 0.7 K/uL   Basophils Relative 0 0 - 1 %   Basophils Absolute 0.1 0.0 - 0.1 K/uL  Urinalysis, Routine w reflex microscopic (not at [facility])  Result Value Ref Range   Color, Urine YELLOW YELLOW   APPearance CLEAR CLEAR   Specific Gravity, Urine <1.005 (L) 1.005 - 1.030   pH 6.0 5.0 - 8.0   Glucose, UA NEGATIVE NEGATIVE mg/dL   Hgb urine dipstick NEGATIVE NEGATIVE   Bilirubin Urine NEGATIVE NEGATIVE   Ketones, ur NEGATIVE NEGATIVE mg/dL   Protein, ur NEGATIVE NEGATIVE mg/dL   Urobilinogen, UA 0.2 0.0 - 1.0 mg/dL   Nitrite NEGATIVE NEGATIVE   Leukocytes, UA NEGATIVE NEGATIVE     2200:  CT A/P pending. Dispo based on results. Sign out to Dr. Lacinda Axon.      Francine Graven, DO 04/21/15 2200

## 2015-04-21 NOTE — Discharge Instructions (Signed)
Diverticulitis Diverticulitis is when small pockets that have formed in your colon (large intestine) become infected or swollen. HOME CARE  Follow your doctor's instructions.  Follow a special diet if told by your doctor.  When you feel better, your doctor may tell you to change your diet. You may be told to eat a lot of fiber. Fruits and vegetables are good sources of fiber. Fiber makes it easier to poop (have bowel movements).  Take supplements or probiotics as told by your doctor.  Only take medicines as told by your doctor.  Keep all follow-up visits with your doctor. GET HELP IF:  Your pain does not get better.  You have a hard time eating food.  You are not pooping like normal. GET HELP RIGHT AWAY IF:  Your pain gets worse.  Your problems do not get better.  Your problems suddenly get worse.  You have a fever.  You keep throwing up (vomiting).  You have bloody or black, tarry poop (stool). MAKE SURE YOU:   Understand these instructions.  Will watch your condition.  Will get help right away if you are not doing well or get worse. Document Released: 03/09/2008 Document Revised: 09/26/2013 Document Reviewed: 08/16/2013 [facility] Patient Information 2015 [place], [place]. This information is not intended to replace advice given to you by your health care provider. Make sure you discuss any questions you have with your health care provider.   You have inflammation in your colon called diverticulitis. Prescription for to antibiotic, pain medicine, nausea medicine. Follow-up your doctor at home.

## 2015-04-21 NOTE — ED Notes (Signed)
Pt c/o left lower abd pain that has been ongoing for a month, was seen by pcp, had testing done and was told everything was normal, pt admits to taking large amounts of Asprin due to back pain as well,

## 2015-11-04 DIAGNOSIS — G8929 Other chronic pain: Secondary | ICD-10-CM | POA: Insufficient documentation

## 2017-08-11 ENCOUNTER — Other Ambulatory Visit: Payer: Self-pay | Admitting: Student

## 2017-08-11 DIAGNOSIS — G8929 Other chronic pain: Secondary | ICD-10-CM

## 2017-08-11 DIAGNOSIS — M545 Low back pain: Principal | ICD-10-CM

## 2017-09-10 ENCOUNTER — Ambulatory Visit: Payer: Self-pay

## 2017-09-10 ENCOUNTER — Ambulatory Visit: Payer: Medicaid Other

## 2019-06-02 ENCOUNTER — Other Ambulatory Visit: Payer: Self-pay | Admitting: Neurology

## 2019-06-02 ENCOUNTER — Other Ambulatory Visit (HOSPITAL_COMMUNITY): Payer: Self-pay | Admitting: Neurology

## 2019-06-02 DIAGNOSIS — M5416 Radiculopathy, lumbar region: Secondary | ICD-10-CM

## 2019-06-09 ENCOUNTER — Ambulatory Visit (HOSPITAL_COMMUNITY): Payer: Medicaid Other

## 2019-06-16 ENCOUNTER — Ambulatory Visit (HOSPITAL_COMMUNITY): Payer: Medicaid Other

## 2019-06-16 ENCOUNTER — Encounter (HOSPITAL_COMMUNITY): Payer: Self-pay

## 2019-10-04 ENCOUNTER — Emergency Department (HOSPITAL_COMMUNITY)
Admission: EM | Admit: 2019-10-04 | Discharge: 2019-10-04 | Disposition: A | Discharge status: Home / Self Care | Payer: Medicaid Other | Attending: Emergency Medicine | Admitting: Emergency Medicine

## 2019-10-04 ENCOUNTER — Encounter (HOSPITAL_COMMUNITY): Payer: Self-pay | Admitting: Emergency Medicine

## 2019-10-04 ENCOUNTER — Emergency Department (HOSPITAL_COMMUNITY): Payer: Medicaid Other

## 2019-10-04 ENCOUNTER — Other Ambulatory Visit: Payer: Self-pay

## 2019-10-04 DIAGNOSIS — F17200 Nicotine dependence, unspecified, uncomplicated: Secondary | ICD-10-CM | POA: Insufficient documentation

## 2019-10-04 DIAGNOSIS — L03211 Cellulitis of face: Secondary | ICD-10-CM | POA: Diagnosis not present

## 2019-10-04 DIAGNOSIS — Z79899 Other long term (current) drug therapy: Secondary | ICD-10-CM | POA: Insufficient documentation

## 2019-10-04 DIAGNOSIS — Z7982 Long term (current) use of aspirin: Secondary | ICD-10-CM | POA: Diagnosis not present

## 2019-10-04 DIAGNOSIS — J449 Chronic obstructive pulmonary disease, unspecified: Secondary | ICD-10-CM | POA: Diagnosis not present

## 2019-10-04 DIAGNOSIS — R600 Localized edema: Secondary | ICD-10-CM | POA: Diagnosis present

## 2019-10-04 LAB — CBC
HCT: 49.5 % (ref 39.0–52.0)
Hemoglobin: 16.5 g/dL (ref 13.0–17.0)
MCH: 30.7 pg (ref 26.0–34.0)
MCHC: 33.3 g/dL (ref 30.0–36.0)
MCV: 92 fL (ref 80.0–100.0)
Platelets: 417 10*3/uL — ABNORMAL HIGH (ref 150–400)
RBC: 5.38 MIL/uL (ref 4.22–5.81)
RDW: 14.3 % (ref 11.5–15.5)
WBC: 15.9 10*3/uL — ABNORMAL HIGH (ref 4.0–10.5)
nRBC: 0 % (ref 0.0–0.2)

## 2019-10-04 LAB — BASIC METABOLIC PANEL
Anion gap: 12 (ref 5–15)
BUN: 12 mg/dL (ref 6–20)
CO2: 24 mmol/L (ref 22–32)
Calcium: 9.7 mg/dL (ref 8.9–10.3)
Chloride: 102 mmol/L (ref 98–111)
Creatinine, Ser: 1.19 mg/dL (ref 0.61–1.24)
GFR calc Af Amer: >60 mL/min (ref >60)
GFR calc non Af Amer: >60 mL/min (ref >60)
Glucose, Bld: 98 mg/dL (ref 70–99)
Potassium: 3.5 mmol/L (ref 3.5–5.1)
Sodium: 138 mmol/L (ref 135–145)

## 2019-10-04 IMAGING — CT CT MAXILLOFACIAL W/ CM
3 series · 15 of 47 positions shown, 18 images · IV contrast (Omnipaque or Isovue)
Comparison: None available.

CLINICAL DATA: Initial evaluation for palpable knot under chin.
History of recent dental extraction on [DATE].

EXAM:
CT MAXILLOFACIAL WITH CONTRAST
TECHNIQUE: Multidetector CT imaging of the maxillofacial structures was
performed with intravenous contrast. Multiplanar CT image
reconstructions were also generated.
CONTRAST:  75mL OMNIPAQUE IOHEXOL 300 MG/ML  SOLN

[Series 2: max soft · axial · 0.39mm/px · z∈[+1543,+1713]mm · 9 of 99 slices shown, 12 images]
[im 7/99  brain]
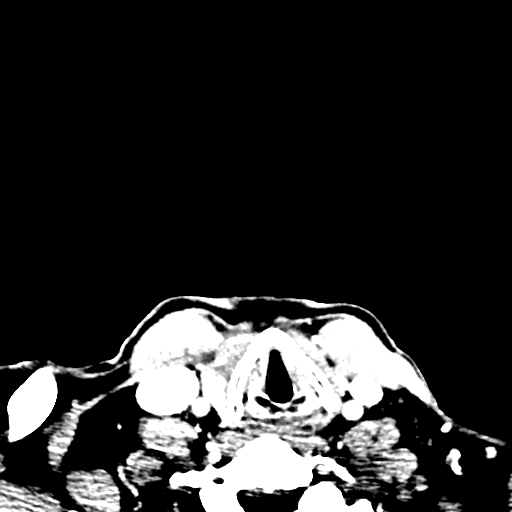
[im 7/99  bone]
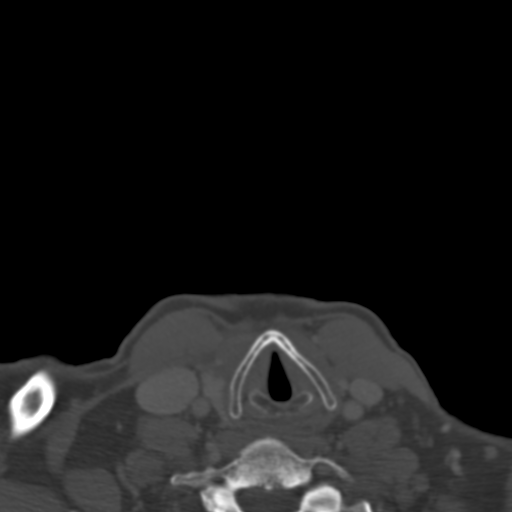
[im 17/99  bone]
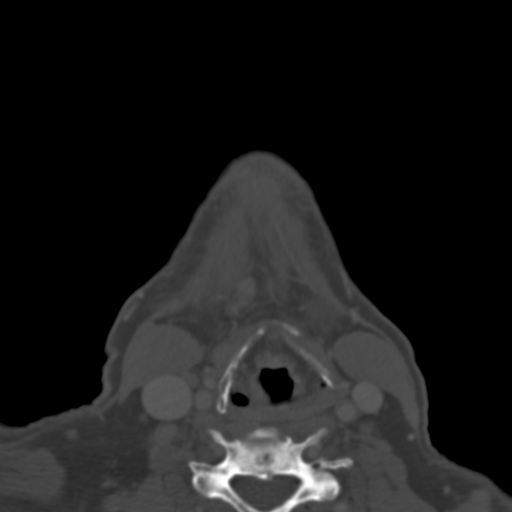
[im 28/99  bone]
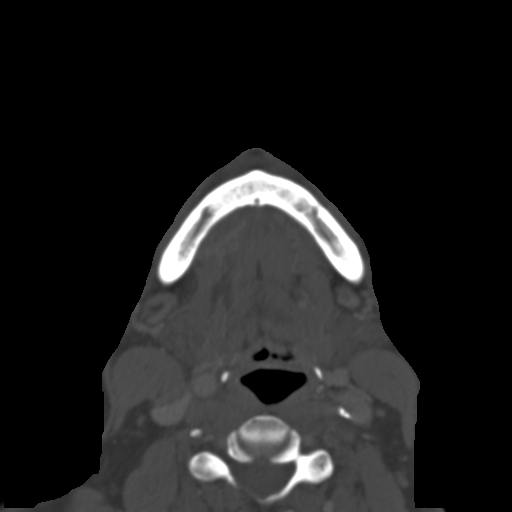
[im 38/99  bone]
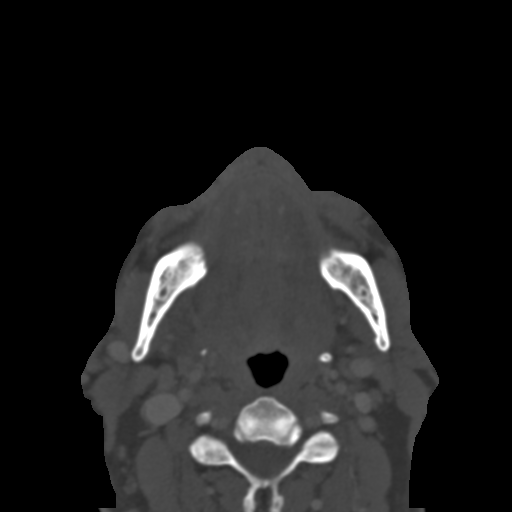
[im 51/99  brain]
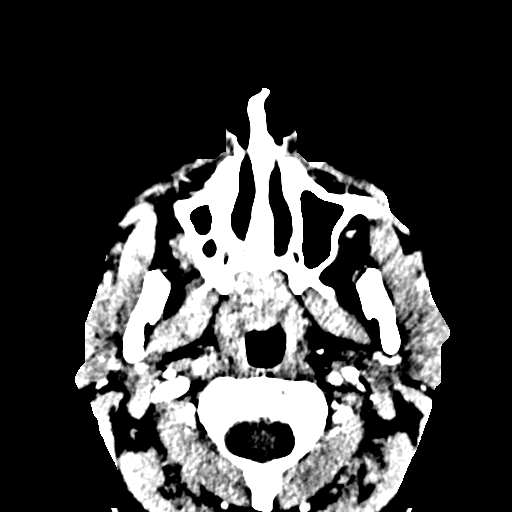
[im 51/99  bone]
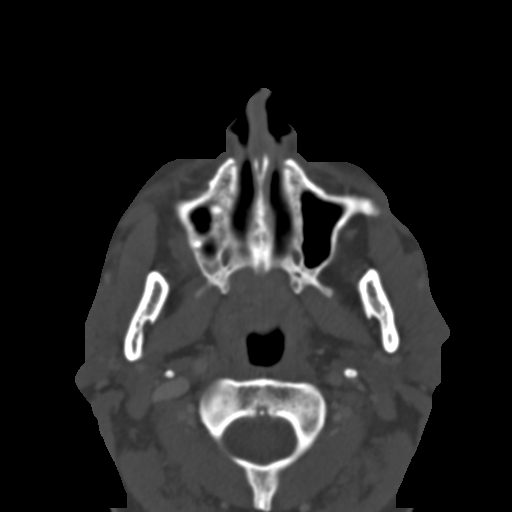
[im 61/99  bone]
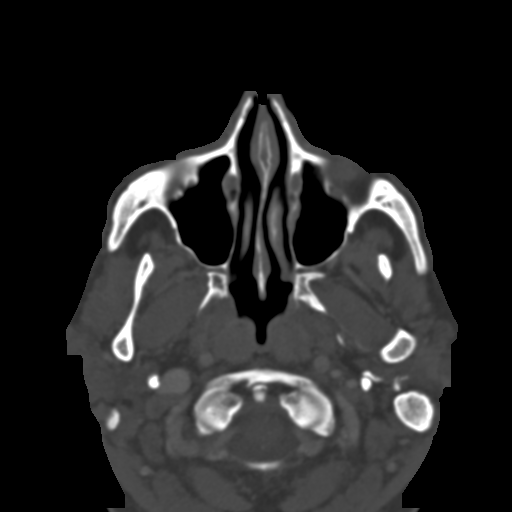
[im 71/99  bone]
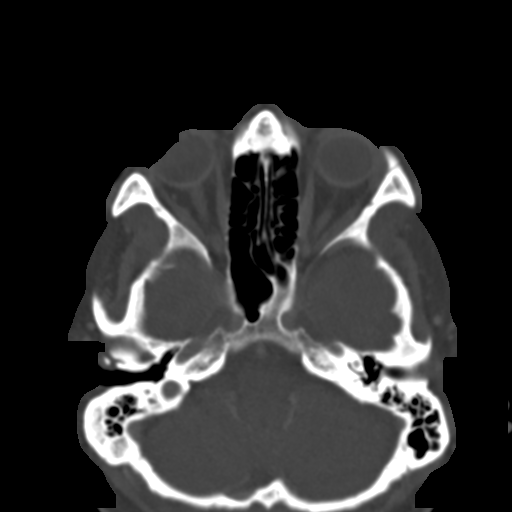
[im 82/99  bone]
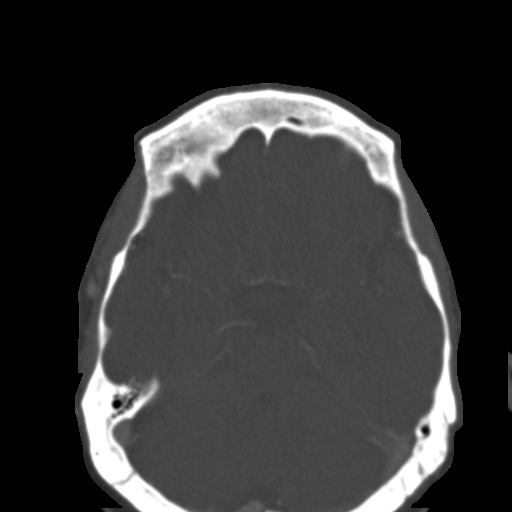
[im 92/99  brain]
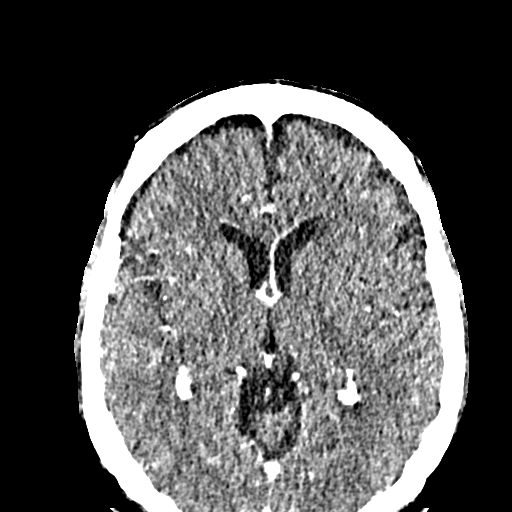
[im 92/99  bone]
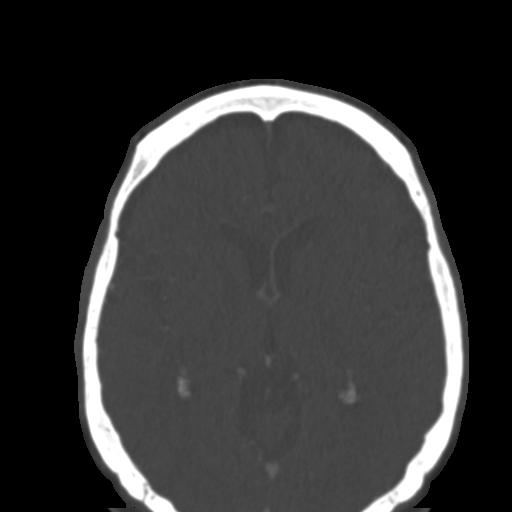

[Series 4: coronal soft · coronal · 0.37mm/px · 3 of 73 slices shown]
[im 25/73  bone]
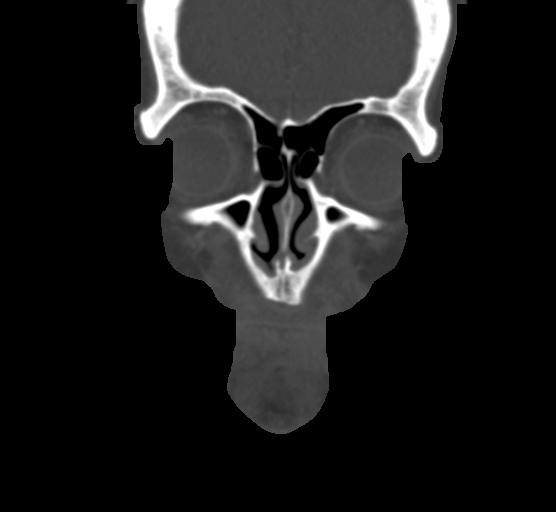
[im 33/73  bone]
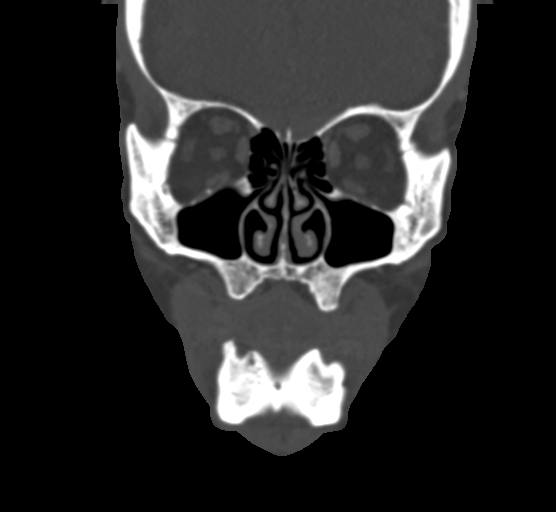
[im 41/73  bone]
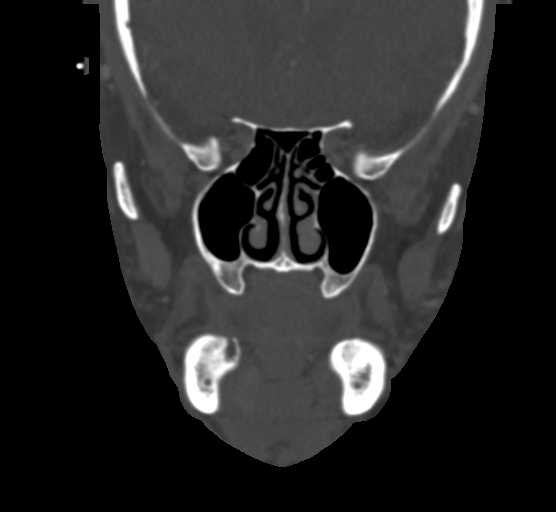

[Series 5: sagittal soft · sagittal · 0.37mm/px · 3 of 101 slices shown]
[im 34/101  bone]
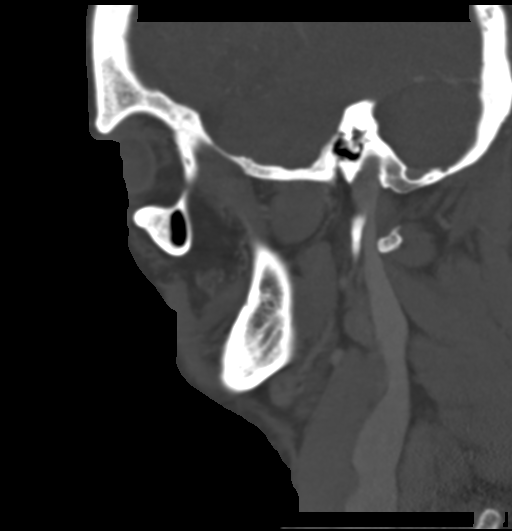
[im 51/101  bone]
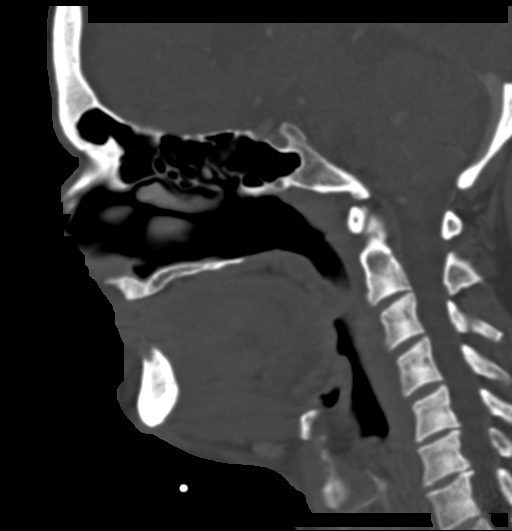
[im 67/101  bone]
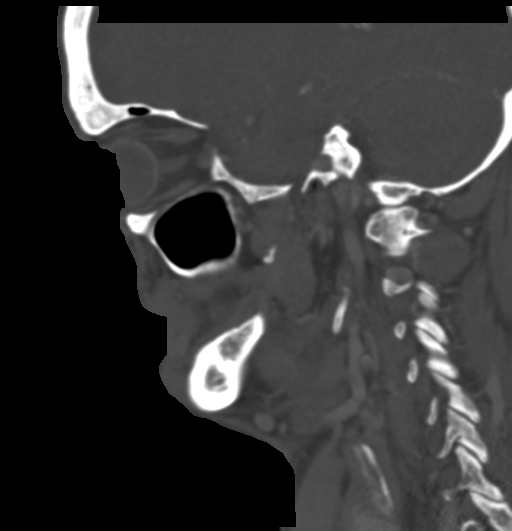

[15 of 47 positions shown; findings below may reference images not displayed]

FINDINGS: Osseous: No acute osseous abnormality about the face. No discrete or
worrisome osseous lesions.

Orbits: Globes and orbital soft tissues within normal limits.

Sinuses: Mild mucosal thickening noted within the posterior left
ethmoidal air cells and maxillary sinuses. Paranasal sinuses are
otherwise clear. Mastoid air cells and middle ear cavities are well
pneumatized and free of fluid.

Soft tissues: Sequelae of recent dental extraction noted at the
anterior mandible. Patient is now with edentulous. Metallic BB
marker overlies the palpable abnormality concern at the
chin/submental region. There is mild hazy stranding within the
underlying subcutaneous fat (series 4, image 34), which could
reflect mild and/or early infection/cellulitis. There is a prominent
9 mm right level I a submental node at this location (series 2,
image 84), presumably reactive. No other adenopathy seen within the
face and/or neck. Adjacent submandibular glands are within normal
limits. No extension of infection into the deeper spaces of the
face/neck.

Limited intracranial: Mild age-related cerebral atrophy. Otherwise
unremarkable.
IMPRESSION: 1. Postoperative changes from recent dental extraction.
2. Mild hazy stranding involving the subcutaneous fat of the
chin/submental region, which could reflect mild and/or early
infection/cellulitis. No discrete abscess or drainable fluid
collection.
3. Prominent 9 mm submental lymph node, likely reactive. Clinical
follow-up to resolution recommended.

## 2019-10-04 MED ORDER — CLINDAMYCIN HCL 150 MG PO CAPS
300.0000 mg | ORAL_CAPSULE | Freq: Once | ORAL | Status: AC
  Administered 2019-10-04: 300 mg via ORAL
  Filled 2019-10-04: qty 2

## 2019-10-04 MED ORDER — IOHEXOL 300 MG/ML  SOLN
75.0000 mL | Freq: Once | INTRAMUSCULAR | Status: AC | PRN
  Administered 2019-10-04: 75 mL via INTRAVENOUS

## 2019-10-04 MED ORDER — CLINDAMYCIN HCL 150 MG PO CAPS: 300.0000 mg | ORAL_CAPSULE | Freq: Four times a day (QID) | ORAL | 0 refills | Status: AC

## 2019-10-04 NOTE — ED Triage Notes (Signed)
Patient reports he had 8 teeth pulled on the lower jaw on Dec 3. Has developed "a knot" under his chin. Onset this morning.

## 2019-10-04 NOTE — ED Provider Notes (Signed)
[facility] EMERGENCY DEPARTMENT Provider Note   CSN: [id number] Arrival date & time: 10/04/19  [place]     History Chief Complaint  Patient presents with  . Abscess    HOVSEP SINE is a 58 y.o. male.  HPI   This patient is a 58 year old male, he has a history of some element of chronic pain.  He had some really poor dentition and several weeks ago had all of his lower teeth pulled.  He now has no teeth the left.  He has noted that over the last week he has had some progressive swelling in the submental area.  This is gradually worsening, he is able to open and close his mouth without difficulty but has significant tenderness when touching under the front edge of his chin.  There is no fevers or chills, no drainage.  Symptoms are persistent and worse with palpation.  Past Medical History:  Diagnosis Date  . Chronic abdominal pain   . Chronic back pain   . Chronic bronchitis   . Chronic nausea   . COPD (chronic obstructive pulmonary disease) ([place])   . Depression   . GERD (gastroesophageal reflux disease)   . H. pylori infection 03/2012   treated with prevpac  . Hyperplastic colon polyp   . Lumbago   . Rectal bleeding   . Shortness of breath   . Sigmoid diverticulosis   . Tubulovillous adenoma     Patient Active Problem List   Diagnosis Date Noted  . Rectal bleeding 02/25/2012  . FH: colon cancer 02/25/2012  . Epigastric pain 02/25/2012  . GERD (gastroesophageal reflux disease) 02/25/2012  . Chest pain 02/25/2012    Past Surgical History:  Procedure Laterality Date  . BACK SURGERY  11/22/1998  . COLONOSCOPY  03/24/12   Dr. Gala Romney- multiple rectal and colonic polyps= tubulovillous adenoma, tular adenoma and hyperplastic polyps ,  sigmoid diverticulosis, poor prep  . ESOPHAGOGASTRODUODENOSCOPY  03/24/12   Dr. Gala Romney- patulous EG junction, moderate hiatal hernia, abnormal gastric mucosa, duodenal erosions, +hpylori  . FINGER SURGERY         Family History  Problem  Relation Age of Onset  . Colon cancer Father        age? died in prison from colon cancer  . Breast cancer Mother        deceased    Social History   Tobacco Use  . Smoking status: Current Every Day Smoker    Packs/day: 0.50    Years: 25.00    Pack years: 12.50  . Smokeless tobacco: Never Used  Substance Use Topics  . Alcohol use: No  . Drug use: No    Home Medications Prior to Admission medications   Medication Sig Start Date End Date Taking? Authorizing Provider  albuterol (PROVENTIL HFA;VENTOLIN HFA) 108 (90 BASE) MCG/ACT inhaler Inhale 2 puffs into the lungs every 6 (six) hours as needed.    [provider]  aspirin 325 MG tablet Take 325 mg by mouth daily.    [provider]  Aspirin-Caffeine 500-32.5 MG TABS Take 2 tablets by mouth 2 (two) times daily as needed (Back Pain).    [provider]  atenolol (TENORMIN) 25 MG tablet Take 25 mg by mouth at bedtime.    [provider]  ciprofloxacin (CIPRO) 500 MG tablet Take 1 tablet (500 mg total) by mouth 2 (two) times daily. 04/21/15   Nat Christen, MD  clindamycin (CLEOCIN) 150 MG capsule Take 2 capsules (300 mg total) by mouth  4 (four) times daily for 10 days. May dispense as 150mg  capsules 10/04/19 10/14/19  Noemi Chapel, MD  lubiprostone (AMITIZA) 24 MCG capsule Take 24 mcg by mouth 2 (two) times daily with a meal.    [provider]  metroNIDAZOLE (FLAGYL) 500 MG tablet Take 1 tablet (500 mg total) by mouth 4 (four) times daily. 04/21/15   Nat Christen, MD  omeprazole (PRILOSEC) 20 MG capsule TAKE ONE CAPSULE BY MOUTH DAILY 02/24/13   Mahala Menghini, PA-C  omeprazole (PRILOSEC) 40 MG capsule Take 40 mg by mouth daily.    [provider]  oxyCODONE-acetaminophen (PERCOCET/ROXICET) 5-325 MG per tablet Take 1-2 tablets by mouth every 6 (six) hours as needed. 04/21/15   Nat Christen, MD  pravastatin (PRAVACHOL) 40 MG tablet Take 40 mg by mouth daily.    [provider]    promethazine (PHENERGAN) 25 MG tablet Take 1 tablet (25 mg total) by mouth every 6 (six) hours as needed. 04/21/15   Nat Christen, MD    Allergies    Patient has no known allergies.  Review of Systems   Review of Systems  Constitutional: Negative for fever.  HENT: Positive for dental problem. Negative for sore throat.     Physical Exam Updated Vital Signs BP (!) 138/93 (BP Location: Right Arm)   Pulse (!) 124   Temp 98.2 F (36.8 C) (Oral)   Resp 18   Ht 1.753 m (5\' 9" )   Wt 90.4 kg   SpO2 100%   BMI 29.42 kg/m   Physical Exam Vitals and nursing note reviewed.  Constitutional:      General: He is not in acute distress.    Appearance: He is well-developed. He is not diaphoretic.  HENT:     Head: Normocephalic and atraumatic.     Mouth/Throat:     Pharynx: No oropharyngeal exudate.     Comments: Teeth absent, there is mild tenderness over the lower gingiva and gums, there is no obvious swelling in the mouth, he can fully open and close his mouth without any difficulty.  There is no tenderness over the jaws bilaterally but in the front over the point of the mandible and in the submental area there is some swelling and tenderness for which he guards.  There is no obvious redness to the skin Eyes:     General: No scleral icterus.    Conjunctiva/sclera: Conjunctivae normal.  Neck:     Thyroid: No thyromegaly.  Cardiovascular:     Rate and Rhythm: Regular rhythm. Tachycardia present.     Comments: The pulse was approximately 105 on my exam Pulmonary:     Effort: Pulmonary effort is normal.     Breath sounds: Normal breath sounds.  Musculoskeletal:        General: No swelling or tenderness.     Cervical back: Normal range of motion and neck supple.     Right lower leg: No edema.     Left lower leg: No edema.  Lymphadenopathy:     Cervical: No cervical adenopathy.  Skin:    General: Skin is warm and dry.     Findings: No rash.  Neurological:     Mental Status: He is  alert.     Comments: The patient's gait is stable, speech is normal, memory is intact     ED Results / Procedures / Treatments   Labs (all labs ordered are listed, but only abnormal results are displayed) Labs Reviewed  CBC - Abnormal; Notable  for the following components:      Result Value   WBC 15.9 (*)    Platelets 417 (*)    All other components within normal limits  BASIC METABOLIC PANEL    EKG None  Radiology CT Maxillofacial W Contrast  Result Date: 10/04/2019 CLINICAL DATA:  Initial evaluation for palpable knot under chin. History of recent dental extraction on 09/07/2019. EXAM: CT MAXILLOFACIAL WITH CONTRAST TECHNIQUE: Multidetector CT imaging of the maxillofacial structures was performed with intravenous contrast. Multiplanar CT image reconstructions were also generated. CONTRAST:  23mL OMNIPAQUE IOHEXOL 300 MG/ML  SOLN COMPARISON:  None available. FINDINGS: Osseous: No acute osseous abnormality about the face. No discrete or worrisome osseous lesions. Orbits: Globes and orbital soft tissues within normal limits. Sinuses: Mild mucosal thickening noted within the posterior left ethmoidal air cells and maxillary sinuses. Paranasal sinuses are otherwise clear. Mastoid air cells and middle ear cavities are well pneumatized and free of fluid. Soft tissues: Sequelae of recent dental extraction noted at the anterior mandible. Patient is now with edentulous. Metallic BB marker overlies the palpable abnormality concern at the chin/submental region. There is mild hazy stranding within the underlying subcutaneous fat (series 4, image 34), which could reflect mild and/or early infection/cellulitis. There is a prominent 9 mm right level I a submental node at this location (series 2, image 84), presumably reactive. No other adenopathy seen within the face and/or neck. Adjacent submandibular glands are within normal limits. No extension of infection into the deeper spaces of the face/neck. Limited  intracranial: Mild age-related cerebral atrophy. Otherwise unremarkable. IMPRESSION: 1. Postoperative changes from recent dental extraction. 2. Mild hazy stranding involving the subcutaneous fat of the chin/submental region, which could reflect mild and/or early infection/cellulitis. No discrete abscess or drainable fluid collection. 3. Prominent 9 mm submental lymph node, likely reactive. Clinical follow-up to resolution recommended. Electronically Signed   By: Jeannine Boga M.D.   On: 10/04/2019 20:24    Procedures Procedures (including critical care time)  Medications Ordered in ED Medications  clindamycin (CLEOCIN) capsule 300 mg (has no administration in time range)  iohexol (OMNIPAQUE) 300 MG/ML solution 75 mL (75 mLs Intravenous Contrast Given 10/04/19 1955)    ED Course  I have reviewed the triage vital signs and the nursing notes.  Pertinent labs & imaging results that were available during my care of the patient were reviewed by me and considered in my medical decision making (see chart for details).  Clinical Course as of Oct 03 2029  Wed Oct 04, 2019  2026 WBC elevated at 16,000, CT shows cellulitis of the chin / slight submandibular - no deep tissue infection / abscess or Ludwig's - airway intact, pt stable for d/c on Clindamycin.   [BM]    Clinical Course User Index [BM] Noemi Chapel, MD   MDM Rules/Calculators/A&P                      This patient has no lymphadenopathy of his neck, he is able to fully range his neck and open and close his mouth without trismus or torticollis.  He does have some swelling in the submental area that is nondescript and will need further evaluation with CT scan.  That being said he is able to fully extrude his tongue and there is no tenderness in the sublingual area.  CT scan pending.  Final Clinical Impression(s) / ED Diagnoses Final diagnoses:  Facial cellulitis    Rx / DC Orders ED Discharge Orders  Ordered     clindamycin (CLEOCIN) 150 MG capsule  4 times daily     10/04/19 2029           Noemi Chapel, MD 10/04/19 2030

## 2019-10-04 NOTE — Discharge Instructions (Signed)
You may take Tylenol or ibuprofen for pain as needed, you do have an early skin infection and the lump that you feel under your chin is a swollen gland.  This should get better with the antibiotics that we have prescribed, clindamycin 4 times daily for 10 days.  Make sure you do not miss any doses.  Seek medical exam for increasing pain, swelling, difficulty breathing swallowing or eating or opening and closing her mouth.  This will gradually get better over the next several days but if it gets worse come back to the hospital.  Follow-up with your dentist within 1 week if no improvement

## 2020-10-11 ENCOUNTER — Other Ambulatory Visit (HOSPITAL_COMMUNITY): Payer: Self-pay | Admitting: Neurology

## 2020-10-11 ENCOUNTER — Other Ambulatory Visit: Payer: Self-pay | Admitting: Neurology

## 2020-10-11 DIAGNOSIS — M5412 Radiculopathy, cervical region: Secondary | ICD-10-CM

## 2020-10-24 ENCOUNTER — Ambulatory Visit (HOSPITAL_COMMUNITY): Payer: Medicaid Other

## 2020-10-24 ENCOUNTER — Encounter (HOSPITAL_COMMUNITY): Payer: Self-pay

## 2020-11-18 ENCOUNTER — Other Ambulatory Visit: Payer: Self-pay

## 2020-11-18 ENCOUNTER — Ambulatory Visit (HOSPITAL_COMMUNITY)
Admission: RE | Admit: 2020-11-18 | Discharge: 2020-11-18 | Disposition: A | Discharge status: Home / Self Care | Payer: Medicaid Other | Source: Ambulatory Visit | Attending: Neurology | Admitting: Neurology

## 2020-11-18 DIAGNOSIS — M5412 Radiculopathy, cervical region: Secondary | ICD-10-CM | POA: Diagnosis present

## 2020-11-18 IMAGING — MR MR CERVICAL SPINE W/O CM
5 series · 30 of 48 positions shown · non-contrast
Comparison: CT cervical spine [DATE].

CLINICAL DATA: Neck pain rating to the right arm for 6 months. No
known injury.

EXAM:
MRI CERVICAL SPINE WITHOUT CONTRAST
TECHNIQUE: Multiplanar, multisequence MR imaging of the cervical spine was
performed. No intravenous contrast was administered.

[Series 5: t2_tse_sag_fast · sagittal · 3.0mm · 0.43mm/px · 7 of 15 slices shown]
[im 1/15]
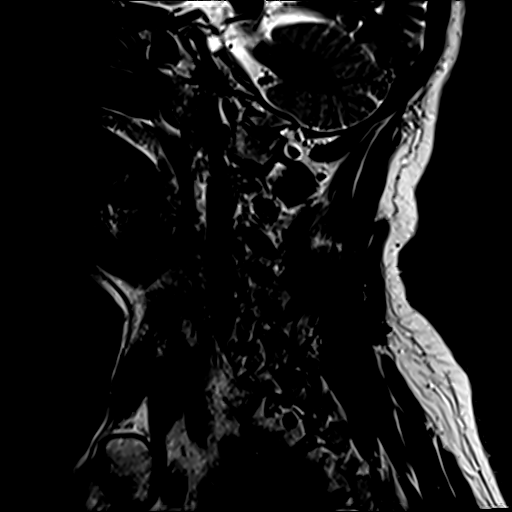
[im 3/15]
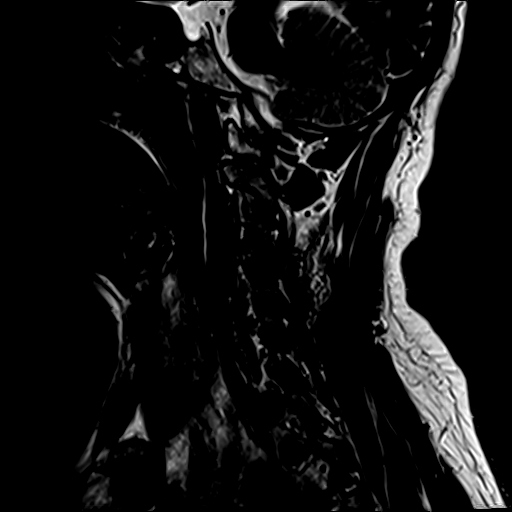
[im 5/15]
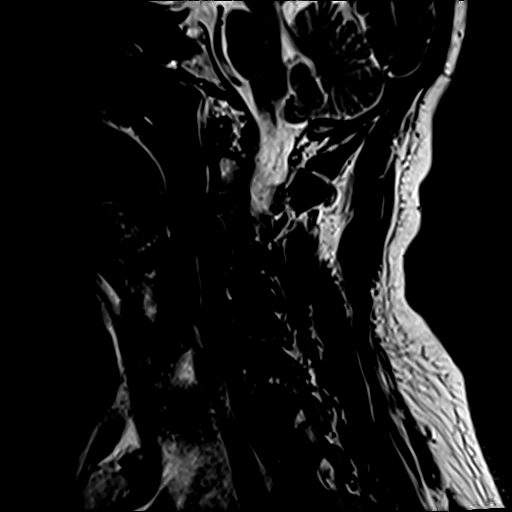
[im 8/15]
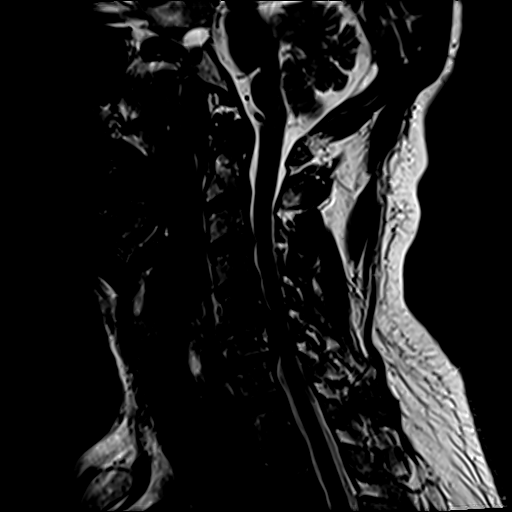
[im 10/15]
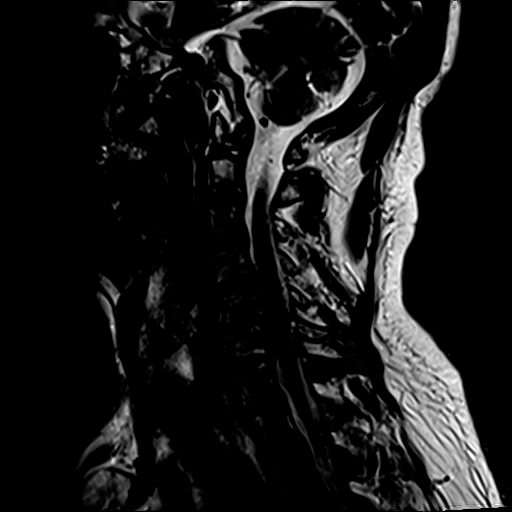
[im 12/15]
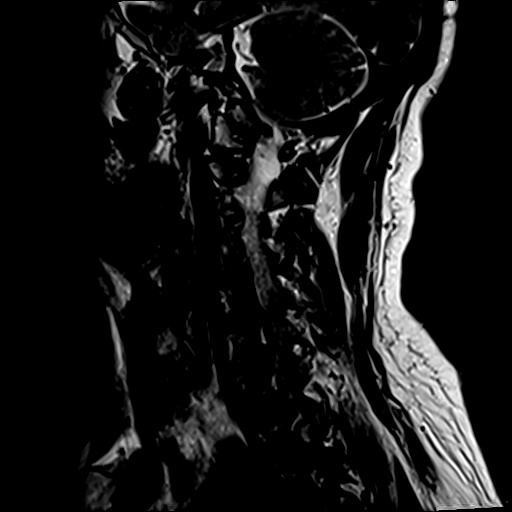
[im 15/15]
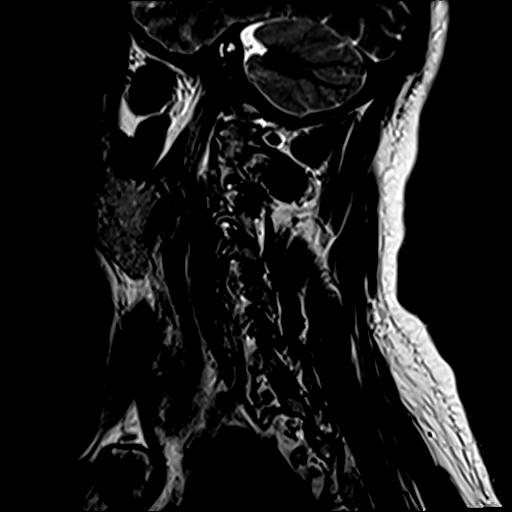

[Series 6: t1_tse_sag_fast · sagittal · 3.0mm · 0.43mm/px · 7 of 15 slices shown]
[im 1/15]
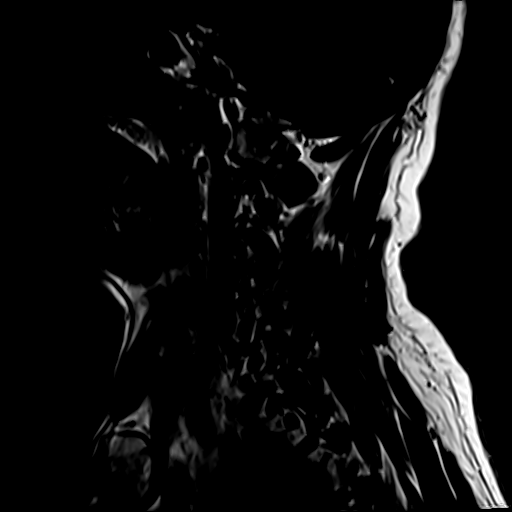
[im 3/15]
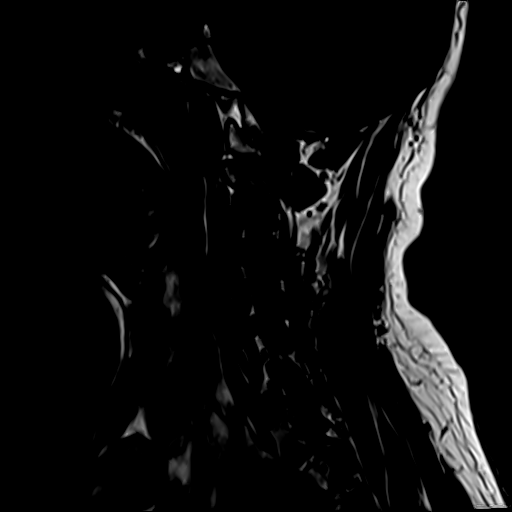
[im 5/15]
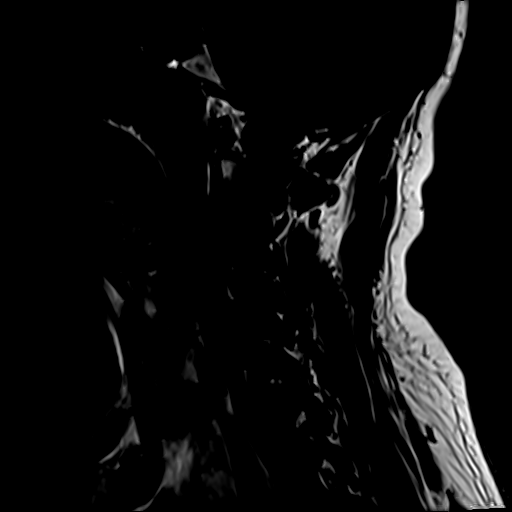
[im 8/15]
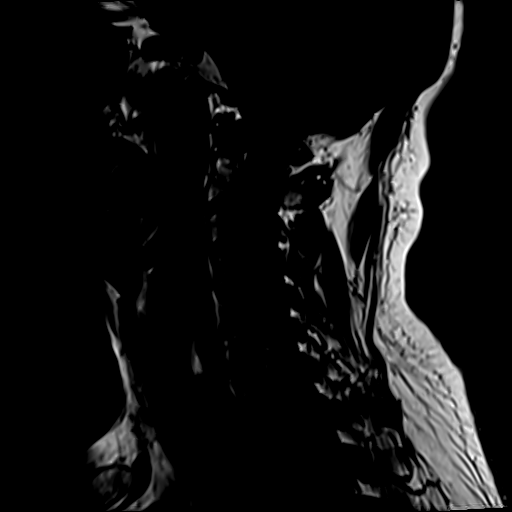
[im 10/15]
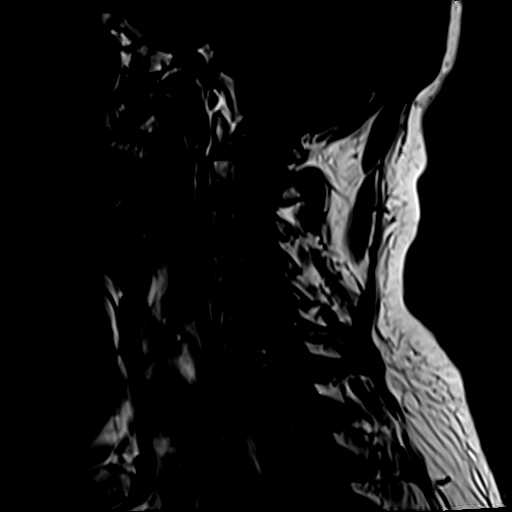
[im 12/15]
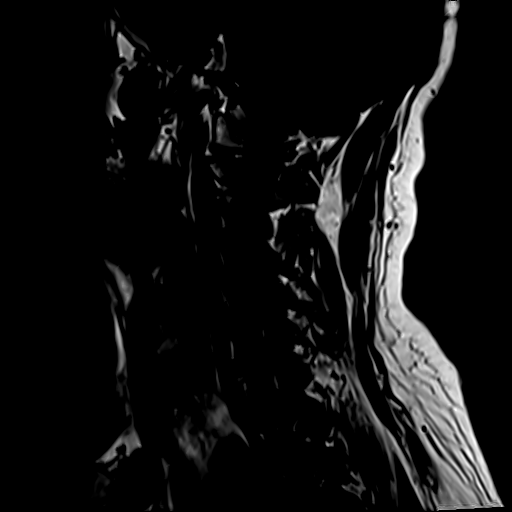
[im 15/15]
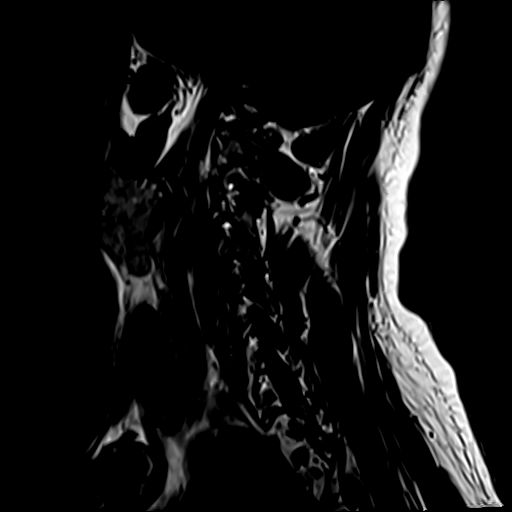

[Series 7: STIR · sagittal · 3.0mm · 0.86mm/px · 6 of 15 slices shown]
[im 1/15]
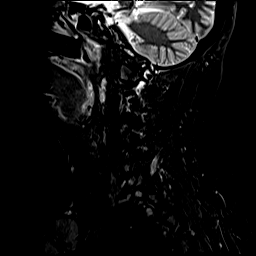
[im 3/15]
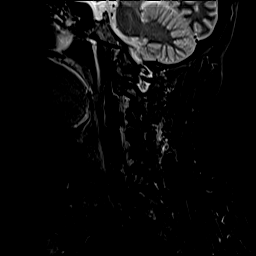
[im 6/15]
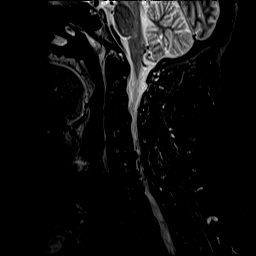
[im 9/15]
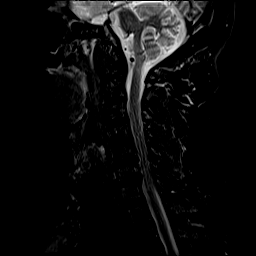
[im 12/15]
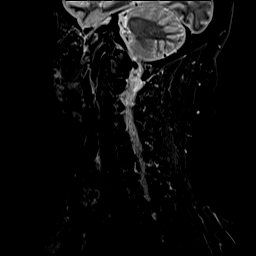
[im 15/15]
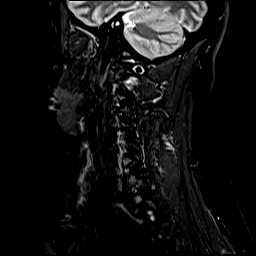

[Series 8: t2_tse_tra_fast · axial · 3.0mm · 0.78mm/px · z∈[-154,-51]mm · 8 of 34 slices shown]
[im 1/34]
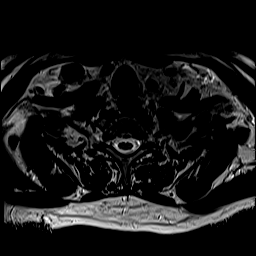
[im 6/34]
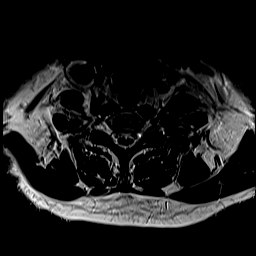
[im 11/34]
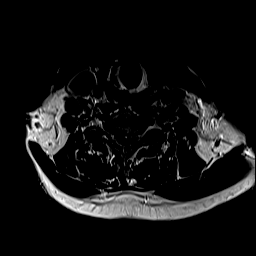
[im 16/34]
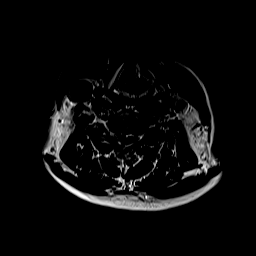
[im 18/34]
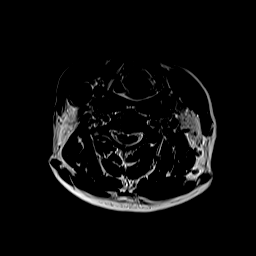
[im 23/34]
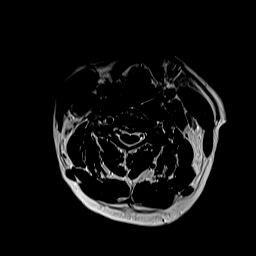
[im 28/34]
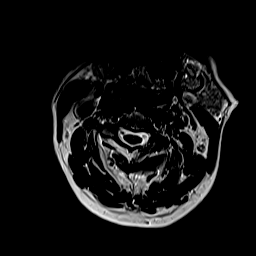
[im 34/34]
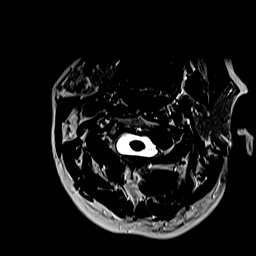

[Series 9: GRE · axial · 3.0mm · 0.78mm/px · z∈[-154,-139]mm · 2 of 34 slices shown]
[im 1/34]
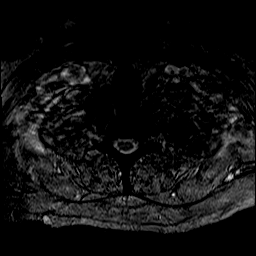
[im 6/34]
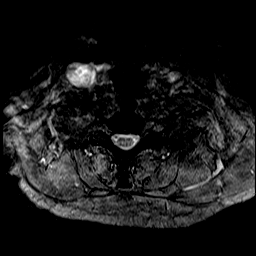

[30 of 48 positions shown; findings below may reference images not displayed]

FINDINGS: Alignment: Maintained.

Vertebrae: No fracture, evidence of discitis, or bone lesion.

Cord: Normal signal throughout.

Posterior Fossa, vertebral arteries, paraspinal tissues: Negative.

Disc levels:

C2-3: Shallow bulge.  No stenosis.

C3-4: There is mild posterior bony ridging. Uncovertebral disease is
seen on the left. Mild left foraminal narrowing. The central canal
and right foramen are open.

C4-5: There is a small central protrusion and some uncovertebral
disease on the left. The central canal and right foramen are open.
Mild to moderate left foraminal narrowing is present.

C5-6: Disc bulge and uncovertebral disease cause mild deformity of
the ventral cord and moderate to severe foraminal narrowing, worse
on the left.

C6-7: Disc osteophyte complex and uncovertebral disease cause mild
deformity of the ventral cord and moderately severe to severe
bilateral foraminal narrowing.

C7-T1: There is some facet degenerative change. This level is
otherwise negative.
IMPRESSION: Mild deformity of the ventral cord at C5-6 where moderate to severe
foraminal narrowing appears worse on the left.

Deformity of the ventral cord and moderately severe to severe
bilateral foraminal narrowing C6-7.

Mild to moderate left foraminal narrowing C4-5. The central canal
and right foramen are open at this level.

## 2020-11-27 DIAGNOSIS — I10 Essential (primary) hypertension: Secondary | ICD-10-CM | POA: Insufficient documentation

## 2021-07-16 ENCOUNTER — Other Ambulatory Visit: Payer: Self-pay | Admitting: Neurology

## 2021-07-16 ENCOUNTER — Other Ambulatory Visit (HOSPITAL_COMMUNITY): Payer: Self-pay | Admitting: Neurology

## 2021-07-16 DIAGNOSIS — M545 Low back pain, unspecified: Secondary | ICD-10-CM

## 2021-07-29 ENCOUNTER — Other Ambulatory Visit: Payer: Self-pay

## 2021-07-29 ENCOUNTER — Ambulatory Visit (HOSPITAL_COMMUNITY)
Admission: RE | Admit: 2021-07-29 | Discharge: 2021-07-29 | Disposition: A | Discharge status: Home / Self Care | Payer: Medicaid Other | Source: Ambulatory Visit | Attending: Neurology | Admitting: Neurology

## 2021-07-29 DIAGNOSIS — M545 Low back pain, unspecified: Secondary | ICD-10-CM | POA: Diagnosis not present

## 2021-07-29 IMAGING — MR MR LUMBAR SPINE W/O CM
5 series · 46 of 48 positions shown · non-contrast
Comparison: X-ray lumbar [DATE]; scoliosis series [DATE].

CLINICAL DATA: Patient complains of lower back pain. Lumbar surgery
reported in [3Z].

EXAM:
MRI LUMBAR SPINE WITHOUT CONTRAST
TECHNIQUE: Multiplanar, multisequence MR imaging of the lumbar spine was
performed. No intravenous contrast was administered.

[Series 5: T2 · sagittal · 4.0mm · 0.74mm/px · 6 of 15 slices shown (1 of 2)]
[im 1/15]
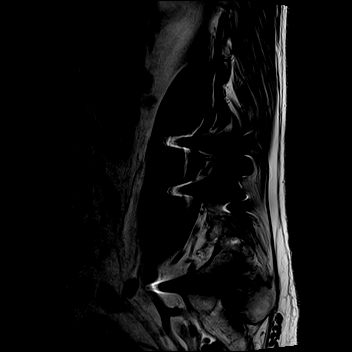
[im 3/15]
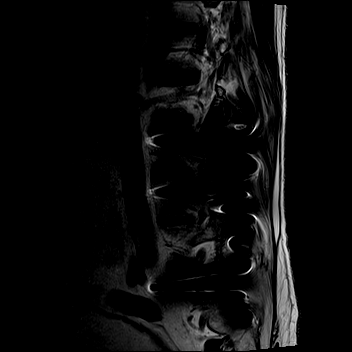
[im 6/15]
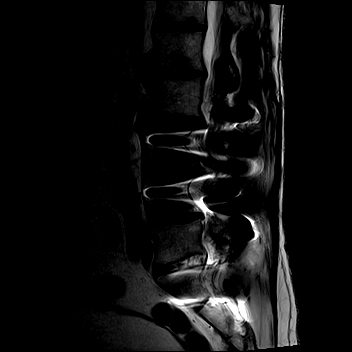
[im 9/15]
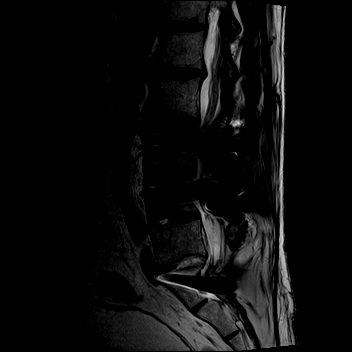
[im 12/15]
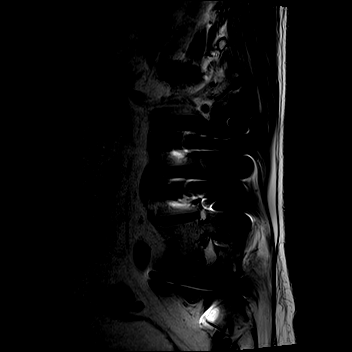
[im 15/15]
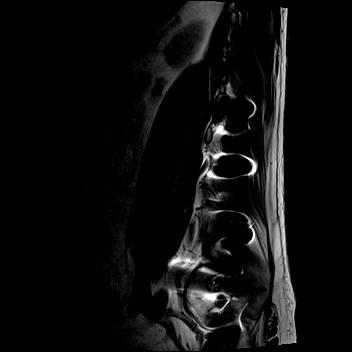

[Series 6: T1 · sagittal · 4.0mm · 0.86mm/px · 6 of 15 slices shown (1 of 2)]
[im 1/15]
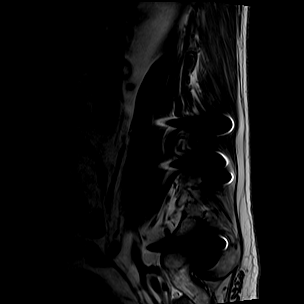
[im 3/15]
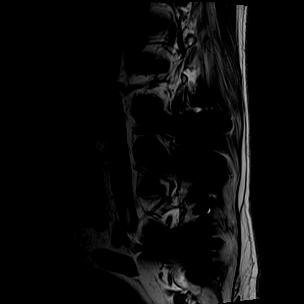
[im 6/15]
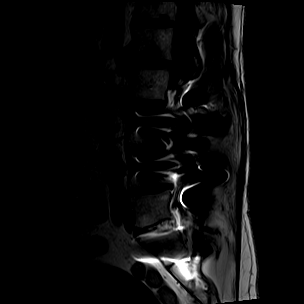
[im 9/15]
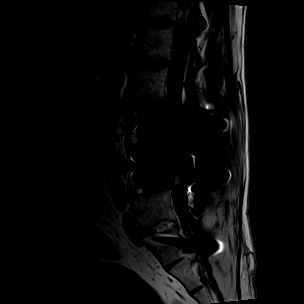
[im 12/15]
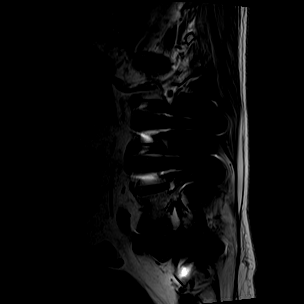
[im 15/15]
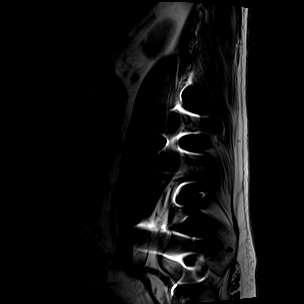

[Series 7: STIR · sagittal · 4.0mm · 0.51mm/px · 6 of 15 slices shown]
[im 1/15]
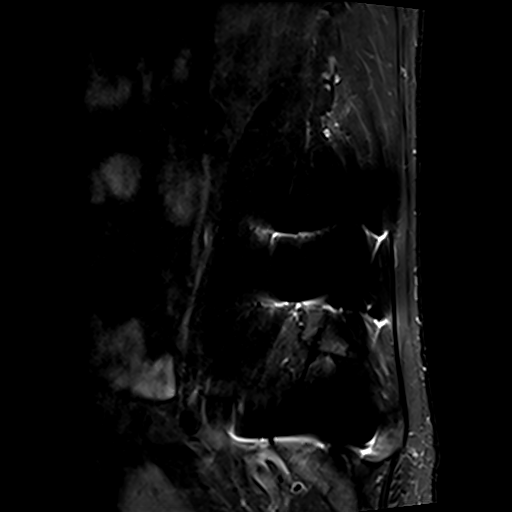
[im 3/15]
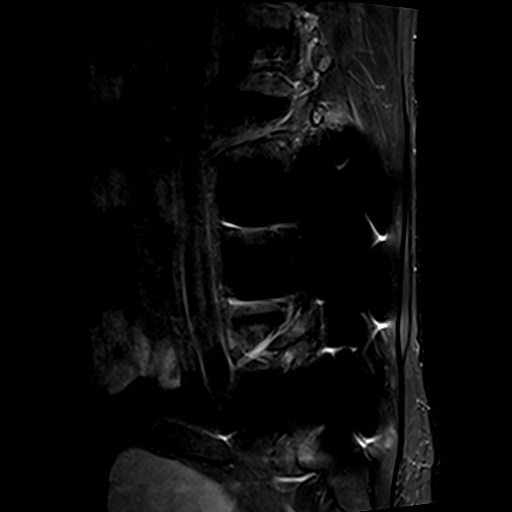
[im 6/15]
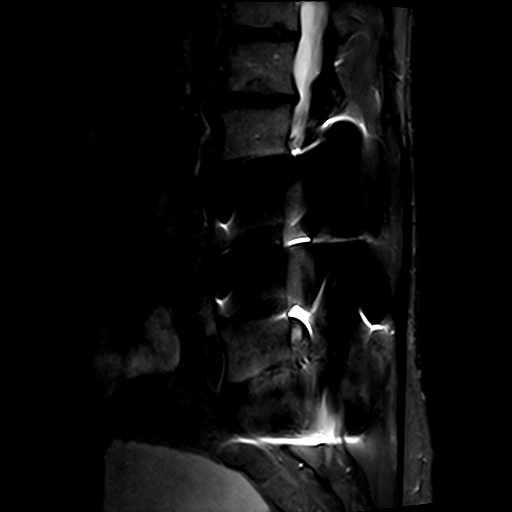
[im 9/15]
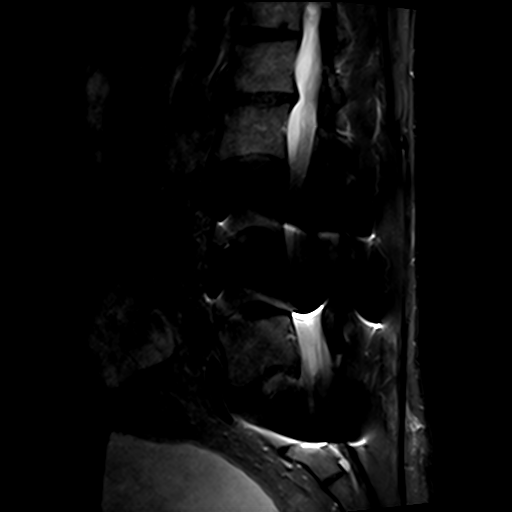
[im 12/15]
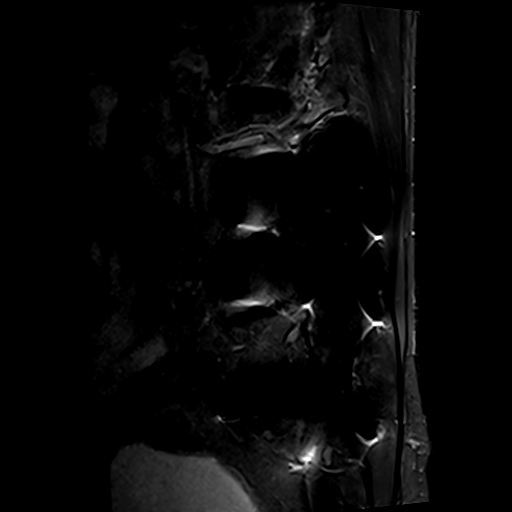
[im 15/15]
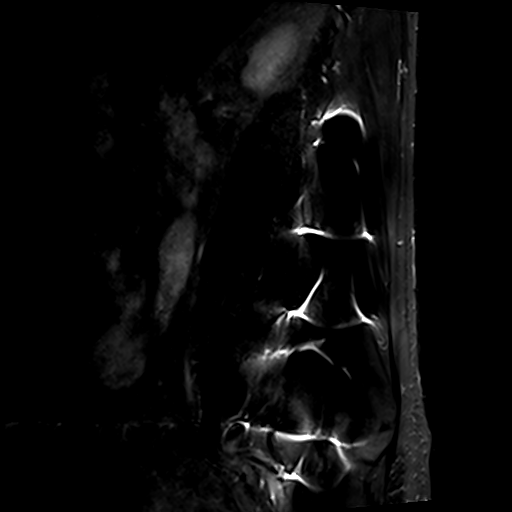

[Series 8: T2 · axial · 4.0mm · 0.75mm/px · z∈[-147,+54]mm · 15 of 35 slices shown (2 of 2)]
[im 1/35]
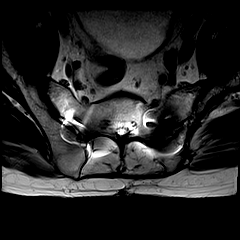
[im 3/35]
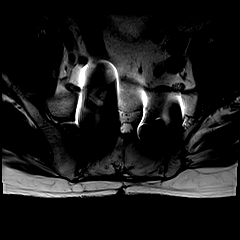
[im 5/35]
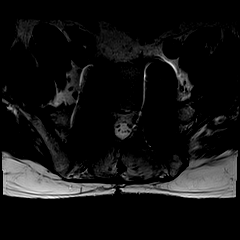
[im 8/35]
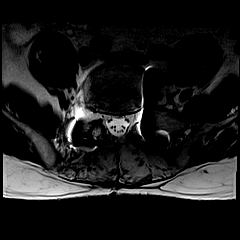
[im 10/35]
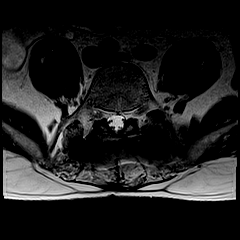
[im 13/35]
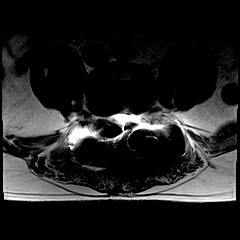
[im 15/35]
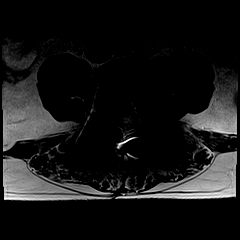
[im 18/35]
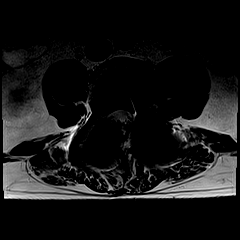
[im 20/35]
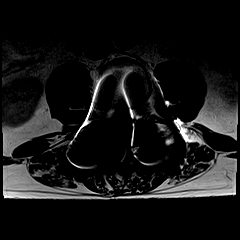
[im 22/35]
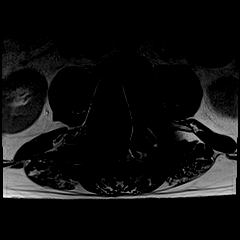
[im 25/35]
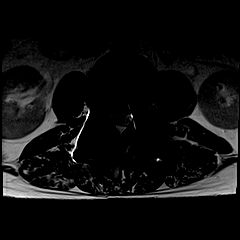
[im 27/35]
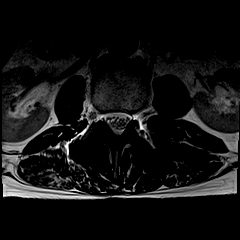
[im 30/35]
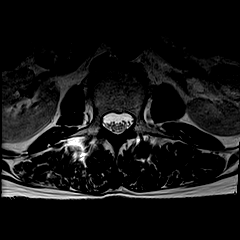
[im 32/35]
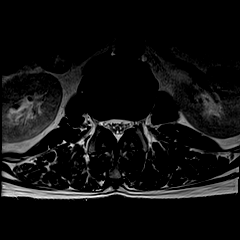
[im 35/35]
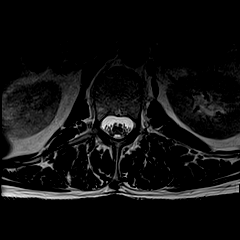

[Series 9: T1 · axial · 4.0mm · 0.70mm/px · z∈[-147,+54]mm · 13 of 35 slices shown (2 of 2)]
[im 1/35]
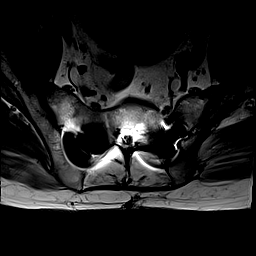
[im 3/35]
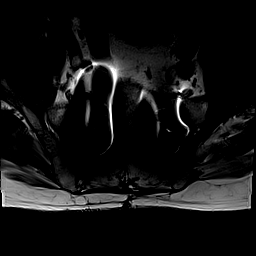
[im 5/35]
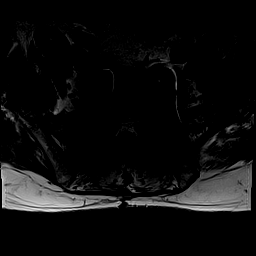
[im 8/35]
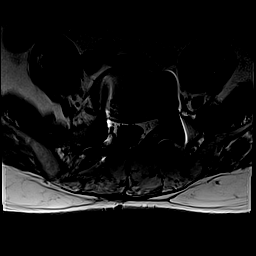
[im 10/35]
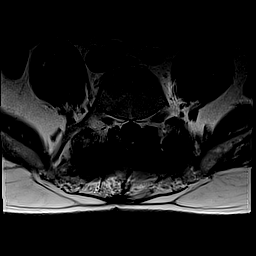
[im 13/35]
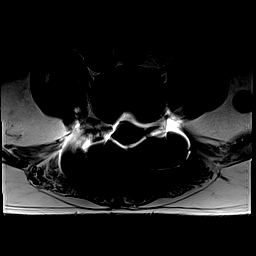
[im 15/35]
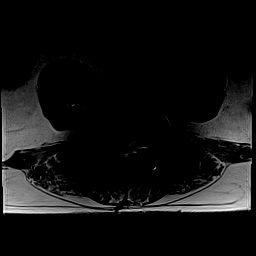
[im 18/35]
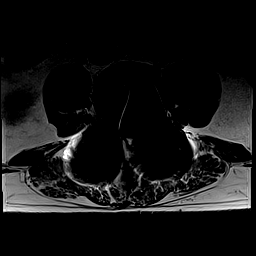
[im 20/35]
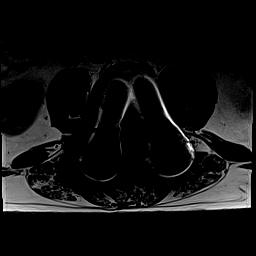
[im 22/35]
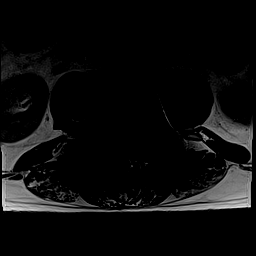
[im 25/35]
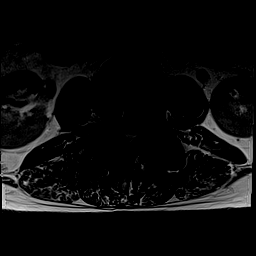
[im 30/35]
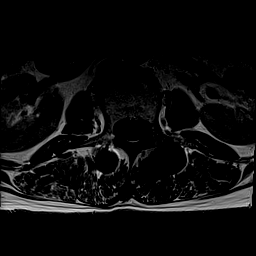
[im 35/35]
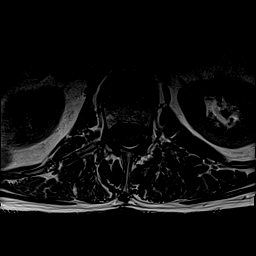

[46 of 48 positions shown; findings below may reference images not displayed]

FINDINGS: Segmentation:  Standard.

Alignment:  Physiologic.

Vertebrae: Prior posterior spinal fusion from L3 through S1 with
bilateral pedicle screws at the L3, L4, and S1 levels. Known
fractures through the vertical stabilization rods are not well seen
by MRI. No evidence of fracture, discitis, or suspicious bone
lesion.

Conus medullaris and cauda equina: Conus extends to the L1-2 level.
Conus and cauda equina appear normal.

Paraspinal and other soft tissues: Colonic diverticulosis. Lower
posterior paraspinal muscle atrophy.

Disc levels:

T12-L1: Sagittal sequences only. No disc protrusion. No foraminal or
canal stenosis.

L1-L2: Mild broad-based disc bulge and mild bilateral facet
hypertrophy. Findings result in mild canal stenosis. No significant
foraminal stenosis.

L2-L3: Partially obscured by susceptibility artifact. Mild annular
disc bulge and bilateral facet arthropathy contribute to mild canal
stenosis. No evidence of foraminal stenosis.

L3-L4: Prior fusion without evidence of residual foraminal or canal
stenosis.

L4-L5: Prior fusion without evidence of residual foraminal or canal
stenosis.

L5-S1: Prior fusion without evidence of residual foraminal or canal
stenosis.
IMPRESSION: 1. Prior posterior spinal fusion from L3 through S1. Known fractures
through the vertical stabilization rods are not well seen by MRI. No
evidence of residual foraminal or canal stenosis at the fused
levels.
2. Mild canal stenosis at the L1-2 and L2-3 levels.

## 2022-01-14 ENCOUNTER — Other Ambulatory Visit: Payer: Self-pay | Admitting: Neurology

## 2022-01-14 DIAGNOSIS — M5412 Radiculopathy, cervical region: Secondary | ICD-10-CM

## 2022-02-10 ENCOUNTER — Other Ambulatory Visit: Payer: Medicaid Other

## 2022-03-10 ENCOUNTER — Encounter: Payer: Self-pay | Admitting: *Deleted

## 2022-07-02 DIAGNOSIS — E782 Mixed hyperlipidemia: Secondary | ICD-10-CM | POA: Insufficient documentation

## 2022-07-02 DIAGNOSIS — M5135 Other intervertebral disc degeneration, thoracolumbar region: Secondary | ICD-10-CM | POA: Insufficient documentation

## 2022-07-02 DIAGNOSIS — M5430 Sciatica, unspecified side: Secondary | ICD-10-CM | POA: Insufficient documentation

## 2022-07-02 DIAGNOSIS — Z720 Tobacco use: Secondary | ICD-10-CM | POA: Insufficient documentation

## 2022-09-08 ENCOUNTER — Encounter: Payer: Self-pay | Admitting: *Deleted

## 2022-11-25 ENCOUNTER — Other Ambulatory Visit: Payer: Self-pay | Admitting: Neurosurgery

## 2022-12-02 ENCOUNTER — Other Ambulatory Visit: Payer: Self-pay | Admitting: Neurosurgery

## 2022-12-16 ENCOUNTER — Encounter (HOSPITAL_COMMUNITY): Payer: Self-pay | Admitting: Neurosurgery

## 2022-12-16 ENCOUNTER — Other Ambulatory Visit: Payer: Self-pay

## 2022-12-16 NOTE — Progress Notes (Signed)
Anesthesia Chart Review: Same-day workup  Patient follows with cardiology at [facility] for history of HTN, HLD, HFmREF, atypical chest pain.  Last seen 08/13/2022 and patient was again reporting symptoms of atypical chest pain.  Nuclear stress test and echo ordered.  Per follow-up telephone encounter by Dr. Charlton Amor on 09/11/2022, "Called patient to discuss echo and Spect results, both of which were largely unremarkable. EF has improved to >55% from 40-45% on prior imaging. Spect was overall a low risk study with a small, fixed perfusion defect in the apical segments that potentially represents artifact, and regardless was also present on a prior Spect 4 years prior without any changes. Also briefly discussed smoking cessation, still actively smoking but working on cutting back and is down to less than 1 ppd. Patient has been seen by both a spine surgeon and chronic pain physician and potentially will be undergoing a stimulator implantation for chronic back pain, and/or a spine surgical procedure. Patient's RCRI score is 0. Overall the patient is low risk for a moderate risk surgery. Further cardiac risk stratification would not meaningfully mitigate the patients risk and therefore is not indicated as there are no signs of acute coronary syndrome, decompensated heart failure, unstable arrhythmia, or significant valvular disease at this time based on imaging results and our last in person clinic visit."  Current smoker with associated COPD.  Patient will need day of surgery labs and evaluation.  Nuclear stress 09/09/2022 (Care Everywhere): -Low risk, probably normal myocardial perfusion study   - There is a very small in size, mild fixed perfusion abnormality involving the apical segments. This is consistent with probable artifact, given normal wall motion and possibly representing the defect noted on prior.  - Left ventricular systolic function is normal. Post stress the ejection fraction is > 60%. The  left ventricle is normal in size.  -Significant coronary calcifications were noted on the attenuation CT  - Incidental CT findings: Dependent right lower lobe consolidation (3:22) and nearby pulmonary nodule (3:11, 3:16) may reflect aspiration given location.   TTE 09/09/2022 (Care Everywhere): Summary   1. The left ventricle is normal in size with normal wall thickness.    2. The left ventricular systolic function is normal, LVEF is visually  estimated at 55%.    3. The aortic valve is trileaflet with mildly thickened leaflets with normal  excursion.   4. The right ventricle is normal in size, with normal systolic function.     Wynonia Musty [facility] Short Stay Center/Anesthesiology Phone [phone] 12/16/2022 10:32 AM

## 2022-12-16 NOTE — Anesthesia Preprocedure Evaluation (Signed)
Anesthesia Evaluation  Patient identified by MRN, date of birth, ID band Patient awake    Reviewed: Allergy & Precautions, H&P , NPO status , Patient's Chart, lab work & pertinent test results  Airway Mallampati: II  TM Distance: >3 FB Neck ROM: Full    Dental no notable dental hx.    Pulmonary COPD, Current Smoker   Pulmonary exam normal breath sounds clear to auscultation       Cardiovascular hypertension, Normal cardiovascular exam Rhythm:Regular Rate:Normal     Neuro/Psych Chronic narcotic use for back pain Percocet  negative psych ROS   GI/Hepatic negative GI ROS, Neg liver ROS,,,  Endo/Other  negative endocrine ROS    Renal/GU negative Renal ROS  negative genitourinary   Musculoskeletal negative musculoskeletal ROS (+)    Abdominal   Peds negative pediatric ROS (+)  Hematology negative hematology ROS (+)   Anesthesia Other Findings   Reproductive/Obstetrics negative OB ROS                             Anesthesia Physical Anesthesia Plan  ASA: 3  Anesthesia Plan: General   Post-op Pain Management: Tylenol PO (pre-op)* and Ketamine IV*   Induction: Intravenous  PONV Risk Score and Plan: 1 and Ondansetron, Dexamethasone and Treatment may vary due to age or medical condition  Airway Management Planned: Oral ETT  Additional Equipment:   Intra-op Plan:   Post-operative Plan: Extubation in OR  Informed Consent: I have reviewed the patients History and Physical, chart, labs and discussed the procedure including the risks, benefits and alternatives for the proposed anesthesia with the patient or authorized representative who has indicated his/her understanding and acceptance.     Dental advisory given  Plan Discussed with: CRNA and Surgeon  Anesthesia Plan Comments: (PAT note by Karoline Caldwell, PA-C: Patient follows with cardiology at [facility] for history of HTN, HLD, HFmREF,  atypical chest pain.  Last seen 08/13/2022 and patient was again reporting symptoms of atypical chest pain.  Nuclear stress test and echo ordered.  Per follow-up telephone encounter by Dr. Charlton Amor on 09/11/2022, "Called patient to discuss echo and Spect results, both of which were largely unremarkable. EF has improved to >55% from 40-45% on prior imaging. Spect was overall a low risk study with a small, fixed perfusion defect in the apical segments that potentially represents artifact, and regardless was also present on a prior Spect 4 years prior without any changes. Also briefly discussed smoking cessation, still actively smoking but working on cutting back and is down to less than 1 ppd. Patient has been seen by both a spine surgeon and chronic pain physician and potentially will be undergoing a stimulator implantation for chronic back pain, and/or a spine surgical procedure. Patient's RCRI score is 0. Overall the patient is low risk for a moderate risk surgery. Further cardiac risk stratification would not meaningfully mitigate the patients risk and therefore is not indicated as there are no signs of acute coronary syndrome, decompensated heart failure, unstable arrhythmia, or significant valvular disease at this time based on imaging results and our last in person clinic visit."  Current smoker with associated COPD.  Patient will need day of surgery labs and evaluation.  Nuclear stress 09/09/2022 (Care Everywhere): -Low risk, probably normal myocardial perfusion study   - There is a very small in size, mild fixed perfusion abnormality involving the apical segments. This is consistent with probable artifact, given normal wall motion and possibly  representing the defect noted on prior.  - Left ventricular systolic function is normal. Post stress the ejection fraction is > 60%. The left ventricle is normal in size.  -Significant coronary calcifications were noted on the attenuation CT  -  Incidental CT findings: Dependent right lower lobe consolidation (3:22) and nearby pulmonary nodule (3:11, 3:16) may reflect aspiration given location.   TTE 09/09/2022 (Care Everywhere): Summary  1. The left ventricle is normal in size with normal wall thickness.  2. The left ventricular systolic function is normal, LVEF is visually  estimated at 55%.  3. The aortic valve is trileaflet with mildly thickened leaflets with normal  excursion.  4. The right ventricle is normal in size, with normal systolic function.   )        Anesthesia Quick Evaluation

## 2022-12-16 NOTE — Progress Notes (Signed)
PCP - Georgiann Mccoy, NP Cardiologist - Tacy Learn, MD  PPM/ICD - denies  EKG - day of surgery Stress Test - 09/09/22 ECHO - 09/09/22 Cardiac Cath - denies  CPAP - n/a  Fasting Blood Sugar - n/a  Blood Thinner Instructions: n/a Aspirin Instructions - last day 12/10/22 per patient Patient was instructed: As of today, STOP taking any Aspirin (unless otherwise instructed by your surgeon) Aleve, Naproxen, Ibuprofen, Motrin, Advil, Goody's, BC's, all herbal medications, fish oil, and all vitamins.  ERAS Protcol - n/a  COVID TEST- n/a  Anesthesia review: yes  Patient verbally denies any shortness of breath, fever, cough and chest pain during phone call   -------------  SDW INSTRUCTIONS given:  Your procedure is scheduled on Thursday, March 14th, 2024.  Report to [facility] Main Entrance "A" at 10:00 A.M., and check in at the Admitting office.  Call this number if you have problems the morning of surgery:  [phone]   Remember:  Do not eat or drink after midnight the night before your surgery    Take these medicines the morning of surgery with A SIP OF WATER: Lipitor, Lyrica, Topamax PRN: Flexeril, Percocet, inhlaer  Please bring the inhaler with you the day of surgery.     The day of surgery:                     Do not wear jewelry,             Do not wear lotions, powders, colognes, or deodorant.            Men may shave face and neck.            Do not bring valuables to the hospital.            The [facility] is not responsible for any belongings or valuables.  Do NOT Smoke (Tobacco/Vaping) 24 hours prior to your procedure If you use a CPAP at night, you may bring all equipment for your overnight stay.   Contacts, glasses, dentures or bridgework may not be worn into surgery.      For patients admitted to the hospital, discharge time will be determined by your treatment team.   Patients discharged the day of surgery will not be allowed to drive  home, and someone needs to stay with them for 24 hours.    Special instructions:   [facility]- Preparing For Surgery  Before surgery, you can play an important role. Because skin is not sterile, your skin needs to be as free of germs as possible. You can reduce the number of germs on your skin by washing with CHG (chlorahexidine gluconate) Soap before surgery.  CHG is an antiseptic cleaner which kills germs and bonds with the skin to continue killing germs even after washing.    Oral Hygiene is also important to reduce your risk of infection.  Remember - BRUSH YOUR TEETH THE MORNING OF SURGERY WITH YOUR REGULAR TOOTHPASTE  Please do not use if you have an allergy to CHG or antibacterial soaps. If your skin becomes reddened/irritated stop using the CHG.  Do not shave (including legs and underarms) for at least 48 hours prior to first CHG shower. It is OK to shave your face.  Please follow these instructions carefully.   Shower the NIGHT BEFORE SURGERY and the MORNING OF SURGERY with DIAL Soap.   Pat yourself dry with a CLEAN TOWEL.  Wear CLEAN PAJAMAS to bed the night before  surgery  Place CLEAN SHEETS on your bed the night of your first shower and DO NOT SLEEP WITH PETS.   Day of Surgery: Please shower morning of surgery  Wear Clean/Comfortable clothing the morning of surgery Do not apply any deodorants/lotions.   Remember to brush your teeth WITH YOUR REGULAR TOOTHPASTE.   Questions were answered. Patient verbalized understanding of instructions.

## 2022-12-17 ENCOUNTER — Observation Stay (HOSPITAL_COMMUNITY)
Admission: RE | Admit: 2022-12-17 | Discharge: 2022-12-18 | Disposition: A | Discharge status: Home / Self Care | Payer: Medicaid Other | Attending: Neurosurgery | Admitting: Neurosurgery

## 2022-12-17 ENCOUNTER — Encounter (HOSPITAL_COMMUNITY): Payer: Self-pay | Admitting: Neurosurgery

## 2022-12-17 ENCOUNTER — Other Ambulatory Visit: Payer: Self-pay

## 2022-12-17 ENCOUNTER — Ambulatory Visit (HOSPITAL_BASED_OUTPATIENT_CLINIC_OR_DEPARTMENT_OTHER): Payer: Medicaid Other | Admitting: Physician Assistant

## 2022-12-17 ENCOUNTER — Encounter (HOSPITAL_COMMUNITY)
Admission: RE | Disposition: A | Discharge status: Home / Self Care | Payer: Self-pay | Source: Home / Self Care | Attending: Neurosurgery

## 2022-12-17 ENCOUNTER — Ambulatory Visit (HOSPITAL_COMMUNITY): Payer: Medicaid Other | Admitting: Physician Assistant

## 2022-12-17 ENCOUNTER — Ambulatory Visit (HOSPITAL_COMMUNITY): Payer: Medicaid Other

## 2022-12-17 DIAGNOSIS — I10 Essential (primary) hypertension: Secondary | ICD-10-CM | POA: Insufficient documentation

## 2022-12-17 DIAGNOSIS — Z79899 Other long term (current) drug therapy: Secondary | ICD-10-CM | POA: Insufficient documentation

## 2022-12-17 DIAGNOSIS — J449 Chronic obstructive pulmonary disease, unspecified: Secondary | ICD-10-CM

## 2022-12-17 DIAGNOSIS — F17210 Nicotine dependence, cigarettes, uncomplicated: Secondary | ICD-10-CM | POA: Diagnosis not present

## 2022-12-17 DIAGNOSIS — M4722 Other spondylosis with radiculopathy, cervical region: Secondary | ICD-10-CM | POA: Diagnosis not present

## 2022-12-17 DIAGNOSIS — Z7982 Long term (current) use of aspirin: Secondary | ICD-10-CM | POA: Insufficient documentation

## 2022-12-17 HISTORY — PX: ANTERIOR CERVICAL DECOMP/DISCECTOMY FUSION: SHX1161

## 2022-12-17 HISTORY — DX: Acute myocardial infarction, unspecified: I21.9

## 2022-12-17 HISTORY — DX: Essential (primary) hypertension: I10

## 2022-12-17 LAB — TYPE AND SCREEN
ABO/RH(D): O POS
Antibody Screen: NEGATIVE

## 2022-12-17 LAB — CBC
HCT: 49 % (ref 39.0–52.0)
Hemoglobin: 15.6 g/dL (ref 13.0–17.0)
MCH: 29.1 pg (ref 26.0–34.0)
MCHC: 31.8 g/dL (ref 30.0–36.0)
MCV: 91.2 fL (ref 80.0–100.0)
Platelets: 408 10*3/uL — ABNORMAL HIGH (ref 150–400)
RBC: 5.37 MIL/uL (ref 4.22–5.81)
RDW: 15.2 % (ref 11.5–15.5)
WBC: 10.6 10*3/uL — ABNORMAL HIGH (ref 4.0–10.5)
nRBC: 0 % (ref 0.0–0.2)

## 2022-12-17 LAB — SURGICAL PCR SCREEN
MRSA, PCR: NEGATIVE
Staphylococcus aureus: NEGATIVE

## 2022-12-17 LAB — BASIC METABOLIC PANEL
Anion gap: 8 (ref 5–15)
BUN: 12 mg/dL (ref 6–20)
CO2: 23 mmol/L (ref 22–32)
Calcium: 9 mg/dL (ref 8.9–10.3)
Chloride: 106 mmol/L (ref 98–111)
Creatinine, Ser: 1.27 mg/dL — ABNORMAL HIGH (ref 0.61–1.24)
GFR, Estimated: >60 mL/min (ref >60)
Glucose, Bld: 98 mg/dL (ref 70–99)
Potassium: 4.1 mmol/L (ref 3.5–5.1)
Sodium: 137 mmol/L (ref 135–145)

## 2022-12-17 LAB — ABO/RH: ABO/RH(D): O POS

## 2022-12-17 SURGERY — ANTERIOR CERVICAL DECOMPRESSION/DISCECTOMY FUSION 2 LEVELS
Anesthesia: General

## 2022-12-17 MED ORDER — PHENOL 1.4 % MT LIQD: 1.0000 | OROMUCOSAL | Status: DC | PRN

## 2022-12-17 MED ORDER — CHLORHEXIDINE GLUCONATE CLOTH 2 % EX PADS: 6.0000 | MEDICATED_PAD | Freq: Once | CUTANEOUS | Status: DC

## 2022-12-17 MED ORDER — HYDROMORPHONE HCL 1 MG/ML IJ SOLN
INTRAMUSCULAR | Status: DC | PRN
  Administered 2022-12-17 (×2): .25 mg via INTRAVENOUS

## 2022-12-17 MED ORDER — THROMBIN 5000 UNITS EX SOLR
OROMUCOSAL | Status: DC | PRN
  Administered 2022-12-17: 5 mL via TOPICAL

## 2022-12-17 MED ORDER — PHENYLEPHRINE HCL-NACL 20-0.9 MG/250ML-% IV SOLN
INTRAVENOUS | Status: DC | PRN
  Administered 2022-12-17: 25 ug/min via INTRAVENOUS

## 2022-12-17 MED ORDER — HYDROMORPHONE HCL 1 MG/ML IJ SOLN: 0.2500 mg | INTRAMUSCULAR | Status: DC | PRN

## 2022-12-17 MED ORDER — MIDAZOLAM HCL 5 MG/5ML IJ SOLN
INTRAMUSCULAR | Status: DC | PRN
  Administered 2022-12-17: 2 mg via INTRAVENOUS

## 2022-12-17 MED ORDER — LACTATED RINGERS IV SOLN: INTRAVENOUS | Status: DC

## 2022-12-17 MED ORDER — SODIUM CHLORIDE 0.9 % IV SOLN: INTRAVENOUS | Status: DC

## 2022-12-17 MED ORDER — SODIUM CHLORIDE 0.9 % IV SOLN: 250.0000 mL | INTRAVENOUS | Status: DC

## 2022-12-17 MED ORDER — MIDAZOLAM HCL 2 MG/2ML IJ SOLN
INTRAMUSCULAR | Status: AC
  Filled 2022-12-17: qty 2

## 2022-12-17 MED ORDER — SUGAMMADEX SODIUM 200 MG/2ML IV SOLN
INTRAVENOUS | Status: DC | PRN
  Administered 2022-12-17: 200 mg via INTRAVENOUS

## 2022-12-17 MED ORDER — THROMBIN 20000 UNITS EX SOLR
CUTANEOUS | Status: DC | PRN
  Administered 2022-12-17: 20 mL via TOPICAL

## 2022-12-17 MED ORDER — PREGABALIN 100 MG PO CAPS
200.0000 mg | ORAL_CAPSULE | Freq: Three times a day (TID) | ORAL | Status: DC
  Administered 2022-12-17 – 2022-12-18 (×2): 200 mg via ORAL
  Filled 2022-12-17 (×2): qty 2

## 2022-12-17 MED ORDER — LIDOCAINE 2% (20 MG/ML) 5 ML SYRINGE
INTRAMUSCULAR | Status: DC | PRN
  Administered 2022-12-17: 100 mg via INTRAVENOUS

## 2022-12-17 MED ORDER — OXYCODONE HCL 5 MG/5ML PO SOLN: 5.0000 mg | Freq: Once | ORAL | Status: DC | PRN

## 2022-12-17 MED ORDER — TOPIRAMATE 100 MG PO TABS: 100.0000 mg | ORAL_TABLET | Freq: Every day | ORAL | Status: DC

## 2022-12-17 MED ORDER — CEFAZOLIN SODIUM-DEXTROSE 2-4 GM/100ML-% IV SOLN
2.0000 g | INTRAVENOUS | Status: AC
  Administered 2022-12-17: 2 g via INTRAVENOUS
  Filled 2022-12-17: qty 100

## 2022-12-17 MED ORDER — METHOCARBAMOL 1000 MG/10ML IJ SOLN
500.0000 mg | Freq: Four times a day (QID) | INTRAVENOUS | Status: DC | PRN
  Filled 2022-12-17: qty 5

## 2022-12-17 MED ORDER — ATORVASTATIN CALCIUM 40 MG PO TABS: 40.0000 mg | ORAL_TABLET | Freq: Every day | ORAL | Status: DC

## 2022-12-17 MED ORDER — FLEET ENEMA 7-19 GM/118ML RE ENEM: 1.0000 | ENEMA | Freq: Once | RECTAL | Status: DC | PRN

## 2022-12-17 MED ORDER — ORAL CARE MOUTH RINSE: 15.0000 mL | Freq: Once | OROMUCOSAL | Status: AC

## 2022-12-17 MED ORDER — PROPOFOL 10 MG/ML IV BOLUS
INTRAVENOUS | Status: DC | PRN
  Administered 2022-12-17: 50 mg via INTRAVENOUS
  Administered 2022-12-17: 150 mg via INTRAVENOUS

## 2022-12-17 MED ORDER — FENTANYL CITRATE (PF) 250 MCG/5ML IJ SOLN
INTRAMUSCULAR | Status: DC | PRN
  Administered 2022-12-17 (×3): 50 ug via INTRAVENOUS
  Administered 2022-12-17: 100 ug via INTRAVENOUS

## 2022-12-17 MED ORDER — THROMBIN 20000 UNITS EX SOLR
CUTANEOUS | Status: AC
  Filled 2022-12-17: qty 20000

## 2022-12-17 MED ORDER — OXYCODONE HCL 5 MG PO TABS: 5.0000 mg | ORAL_TABLET | ORAL | Status: DC | PRN

## 2022-12-17 MED ORDER — MIRTAZAPINE 15 MG PO TABS
15.0000 mg | ORAL_TABLET | Freq: Every day | ORAL | Status: DC
  Administered 2022-12-17: 15 mg via ORAL
  Filled 2022-12-17: qty 1

## 2022-12-17 MED ORDER — OXYCODONE HCL 5 MG PO TABS
10.0000 mg | ORAL_TABLET | ORAL | Status: DC | PRN
  Administered 2022-12-17 – 2022-12-18 (×4): 10 mg via ORAL
  Filled 2022-12-17 (×4): qty 2

## 2022-12-17 MED ORDER — THROMBIN 5000 UNITS EX SOLR
CUTANEOUS | Status: AC
  Filled 2022-12-17: qty 5000

## 2022-12-17 MED ORDER — ACETAMINOPHEN 500 MG PO TABS
1000.0000 mg | ORAL_TABLET | Freq: Once | ORAL | Status: AC
  Administered 2022-12-17: 1000 mg via ORAL
  Filled 2022-12-17: qty 2

## 2022-12-17 MED ORDER — PANTOPRAZOLE SODIUM 40 MG IV SOLR: 40.0000 mg | Freq: Every day | INTRAVENOUS | Status: DC

## 2022-12-17 MED ORDER — LIDOCAINE-EPINEPHRINE 1 %-1:100000 IJ SOLN
INTRAMUSCULAR | Status: AC
  Filled 2022-12-17: qty 1

## 2022-12-17 MED ORDER — KETAMINE HCL 50 MG/5ML IJ SOSY
PREFILLED_SYRINGE | INTRAMUSCULAR | Status: AC
  Filled 2022-12-17: qty 5

## 2022-12-17 MED ORDER — PHENYLEPHRINE 80 MCG/ML (10ML) SYRINGE FOR IV PUSH (FOR BLOOD PRESSURE SUPPORT)
PREFILLED_SYRINGE | INTRAVENOUS | Status: DC | PRN
  Administered 2022-12-17: 120 ug via INTRAVENOUS
  Administered 2022-12-17: 80 ug via INTRAVENOUS

## 2022-12-17 MED ORDER — ALBUTEROL SULFATE (2.5 MG/3ML) 0.083% IN NEBU: 2.5000 mg | INHALATION_SOLUTION | Freq: Four times a day (QID) | RESPIRATORY_TRACT | Status: DC | PRN

## 2022-12-17 MED ORDER — ONDANSETRON HCL 4 MG/2ML IJ SOLN
INTRAMUSCULAR | Status: DC | PRN
  Administered 2022-12-17: 4 mg via INTRAVENOUS

## 2022-12-17 MED ORDER — BUPIVACAINE HCL (PF) 0.5 % IJ SOLN
INTRAMUSCULAR | Status: AC
  Filled 2022-12-17: qty 30

## 2022-12-17 MED ORDER — PANTOPRAZOLE SODIUM 40 MG PO TBEC
40.0000 mg | DELAYED_RELEASE_TABLET | Freq: Every day | ORAL | Status: DC
  Administered 2022-12-17: 40 mg via ORAL
  Filled 2022-12-17: qty 1

## 2022-12-17 MED ORDER — PROPOFOL 10 MG/ML IV BOLUS
INTRAVENOUS | Status: AC
  Filled 2022-12-17: qty 20

## 2022-12-17 MED ORDER — OXYCODONE HCL 5 MG PO TABS: 5.0000 mg | ORAL_TABLET | Freq: Once | ORAL | Status: DC | PRN

## 2022-12-17 MED ORDER — ACETAMINOPHEN 325 MG PO TABS
650.0000 mg | ORAL_TABLET | ORAL | Status: DC | PRN
  Administered 2022-12-17 – 2022-12-18 (×3): 650 mg via ORAL
  Filled 2022-12-17 (×3): qty 2

## 2022-12-17 MED ORDER — SENNA 8.6 MG PO TABS
1.0000 | ORAL_TABLET | Freq: Two times a day (BID) | ORAL | Status: DC
  Administered 2022-12-17: 8.6 mg via ORAL
  Filled 2022-12-17: qty 1

## 2022-12-17 MED ORDER — METHOCARBAMOL 500 MG PO TABS
500.0000 mg | ORAL_TABLET | Freq: Four times a day (QID) | ORAL | Status: DC | PRN
  Administered 2022-12-17 – 2022-12-18 (×2): 500 mg via ORAL
  Filled 2022-12-17 (×2): qty 1

## 2022-12-17 MED ORDER — ROCURONIUM BROMIDE 10 MG/ML (PF) SYRINGE
PREFILLED_SYRINGE | INTRAVENOUS | Status: DC | PRN
  Administered 2022-12-17: 70 mg via INTRAVENOUS

## 2022-12-17 MED ORDER — CHLORHEXIDINE GLUCONATE 0.12 % MT SOLN
15.0000 mL | Freq: Once | OROMUCOSAL | Status: AC
  Administered 2022-12-17: 15 mL via OROMUCOSAL
  Filled 2022-12-17: qty 15

## 2022-12-17 MED ORDER — BISACODYL 10 MG RE SUPP: 10.0000 mg | Freq: Every day | RECTAL | Status: DC | PRN

## 2022-12-17 MED ORDER — ONDANSETRON HCL 4 MG PO TABS: 4.0000 mg | ORAL_TABLET | Freq: Four times a day (QID) | ORAL | Status: DC | PRN

## 2022-12-17 MED ORDER — LIDOCAINE-EPINEPHRINE 1 %-1:100000 IJ SOLN
INTRAMUSCULAR | Status: DC | PRN
  Administered 2022-12-17: 3 mL

## 2022-12-17 MED ORDER — ONDANSETRON HCL 4 MG/2ML IJ SOLN: 4.0000 mg | Freq: Once | INTRAMUSCULAR | Status: DC | PRN

## 2022-12-17 MED ORDER — KETAMINE HCL 10 MG/ML IJ SOLN
INTRAMUSCULAR | Status: DC | PRN
  Administered 2022-12-17: 30 mg via INTRAVENOUS

## 2022-12-17 MED ORDER — DEXAMETHASONE SODIUM PHOSPHATE 10 MG/ML IJ SOLN
INTRAMUSCULAR | Status: DC | PRN
  Administered 2022-12-17: 10 mg via INTRAVENOUS

## 2022-12-17 MED ORDER — SODIUM CHLORIDE 0.9% FLUSH: 3.0000 mL | Freq: Two times a day (BID) | INTRAVENOUS | Status: DC

## 2022-12-17 MED ORDER — ONDANSETRON HCL 4 MG/2ML IJ SOLN: 4.0000 mg | Freq: Four times a day (QID) | INTRAMUSCULAR | Status: DC | PRN

## 2022-12-17 MED ORDER — CEFAZOLIN SODIUM-DEXTROSE 2-4 GM/100ML-% IV SOLN
2.0000 g | Freq: Three times a day (TID) | INTRAVENOUS | Status: AC
  Administered 2022-12-17 – 2022-12-18 (×2): 2 g via INTRAVENOUS
  Filled 2022-12-17 (×2): qty 100

## 2022-12-17 MED ORDER — HYDROMORPHONE HCL 1 MG/ML IJ SOLN
INTRAMUSCULAR | Status: AC
  Filled 2022-12-17: qty 0.5

## 2022-12-17 MED ORDER — MENTHOL 3 MG MT LOZG: 1.0000 | LOZENGE | OROMUCOSAL | Status: DC | PRN

## 2022-12-17 MED ORDER — MORPHINE SULFATE (PF) 2 MG/ML IV SOLN: 2.0000 mg | INTRAVENOUS | Status: DC | PRN

## 2022-12-17 MED ORDER — SODIUM CHLORIDE 0.9% FLUSH: 3.0000 mL | INTRAVENOUS | Status: DC | PRN

## 2022-12-17 MED ORDER — BUPIVACAINE HCL 0.5 % IJ SOLN
INTRAMUSCULAR | Status: DC | PRN
  Administered 2022-12-17: 3 mL

## 2022-12-17 MED ORDER — FENTANYL CITRATE (PF) 250 MCG/5ML IJ SOLN
INTRAMUSCULAR | Status: AC
  Filled 2022-12-17: qty 5

## 2022-12-17 MED ORDER — POLYETHYLENE GLYCOL 3350 17 G PO PACK: 17.0000 g | PACK | Freq: Every day | ORAL | Status: DC | PRN

## 2022-12-17 MED ORDER — DOCUSATE SODIUM 100 MG PO CAPS
100.0000 mg | ORAL_CAPSULE | Freq: Two times a day (BID) | ORAL | Status: DC
  Administered 2022-12-17: 100 mg via ORAL
  Filled 2022-12-17: qty 1

## 2022-12-17 MED ORDER — ACETAMINOPHEN 650 MG RE SUPP: 650.0000 mg | RECTAL | Status: DC | PRN

## 2022-12-17 MED ORDER — 0.9 % SODIUM CHLORIDE (POUR BTL) OPTIME
TOPICAL | Status: DC | PRN
  Administered 2022-12-17: 1000 mL

## 2022-12-17 MED ORDER — KETOROLAC TROMETHAMINE 30 MG/ML IJ SOLN: 30.0000 mg | Freq: Once | INTRAMUSCULAR | Status: DC | PRN

## 2022-12-17 SURGICAL SUPPLY — 66 items
ADH SKN CLS APL DERMABOND .7 (GAUZE/BANDAGES/DRESSINGS) ×1
APL SKNCLS STERI-STRIP NONHPOA (GAUZE/BANDAGES/DRESSINGS)
BAG COUNTER SPONGE SURGICOUNT (BAG) ×1 IMPLANT
BAG SPNG CNTER NS LX DISP (BAG) ×1
BAND INSRT 18 STRL LF DISP RB (MISCELLANEOUS) ×2
BAND RUBBER #18 3X1/16 STRL (MISCELLANEOUS) ×2 IMPLANT
BENZOIN TINCTURE PRP APPL 2/3 (GAUZE/BANDAGES/DRESSINGS) IMPLANT
BLADE CLIPPER SURG (BLADE) IMPLANT
BLADE SURG 11 STRL SS (BLADE) ×1 IMPLANT
BLADE ULTRA TIP 2M (BLADE) IMPLANT
BNDG GAUZE DERMACEA FLUFF 4 (GAUZE/BANDAGES/DRESSINGS) IMPLANT
BNDG GZE DERMACEA 4 6PLY (GAUZE/BANDAGES/DRESSINGS)
BUR MATCHSTICK NEURO 3.0 LAGG (BURR) ×1 IMPLANT
CANISTER SUCT 3000ML PPV (MISCELLANEOUS) ×1 IMPLANT
DERMABOND ADVANCED .7 DNX12 (GAUZE/BANDAGES/DRESSINGS) ×1 IMPLANT
DEVICE ENDSKLTN TC NANOLCK 6MM (Cage) IMPLANT
DRAIN CHANNEL 10M FLAT 3/4 FLT (DRAIN) IMPLANT
DRAPE C-ARM 42X72 X-RAY (DRAPES) ×2 IMPLANT
DRAPE HALF SHEET 40X57 (DRAPES) IMPLANT
DRAPE LAPAROTOMY 100X72 PEDS (DRAPES) ×1 IMPLANT
DRAPE MICROSCOPE SLANT 54X150 (MISCELLANEOUS) ×1 IMPLANT
DRSG OPSITE 4X5.5 SM (GAUZE/BANDAGES/DRESSINGS) ×2 IMPLANT
DRSG OPSITE POSTOP 3X4 (GAUZE/BANDAGES/DRESSINGS) ×2 IMPLANT
DRSG OPSITE POSTOP 4X6 (GAUZE/BANDAGES/DRESSINGS) IMPLANT
DURAPREP 6ML APPLICATOR 50/CS (WOUND CARE) ×1 IMPLANT
ELECT COATED BLADE 2.86 ST (ELECTRODE) ×1 IMPLANT
ELECT REM PT RETURN 9FT ADLT (ELECTROSURGICAL) ×1
ELECTRODE REM PT RTRN 9FT ADLT (ELECTROSURGICAL) ×1 IMPLANT
ENDOSKELETON TC NANOLOCK 6MM (Cage) ×2 IMPLANT
EVACUATOR SILICONE 100CC (DRAIN) IMPLANT
GAUZE 4X4 16PLY ~~LOC~~+RFID DBL (SPONGE) IMPLANT
GLOVE BIO SURGEON STRL SZ7.5 (GLOVE) IMPLANT
GLOVE BIOGEL PI IND STRL 7.5 (GLOVE) ×2 IMPLANT
GLOVE ECLIPSE 7.0 STRL STRAW (GLOVE) ×1 IMPLANT
GLOVE EXAM NITRILE XL STR (GLOVE) IMPLANT
GOWN STRL REUS W/ TWL LRG LVL3 (GOWN DISPOSABLE) ×2 IMPLANT
GOWN STRL REUS W/ TWL XL LVL3 (GOWN DISPOSABLE) IMPLANT
GOWN STRL REUS W/TWL 2XL LVL3 (GOWN DISPOSABLE) IMPLANT
GOWN STRL REUS W/TWL LRG LVL3 (GOWN DISPOSABLE) ×2
GOWN STRL REUS W/TWL XL LVL3 (GOWN DISPOSABLE)
HEMOSTAT POWDER KIT SURGIFOAM (HEMOSTASIS) ×1 IMPLANT
KIT BASIN OR (CUSTOM PROCEDURE TRAY) ×1 IMPLANT
KIT TURNOVER KIT B (KITS) ×1 IMPLANT
NDL SPNL 22GX3.5 QUINCKE BK (NEEDLE) ×1 IMPLANT
NEEDLE HYPO 22GX1.5 SAFETY (NEEDLE) ×1 IMPLANT
NEEDLE SPNL 22GX3.5 QUINCKE BK (NEEDLE) ×1 IMPLANT
NS IRRIG 1000ML POUR BTL (IV SOLUTION) ×1 IMPLANT
PACK LAMINECTOMY NEURO (CUSTOM PROCEDURE TRAY) ×1 IMPLANT
PAD ARMBOARD 7.5X6 YLW CONV (MISCELLANEOUS) ×3 IMPLANT
PLATE ZEVO 1LVL 17MM (Plate) IMPLANT
PUTTY DBF 1CC CORTICAL FIBERS (Putty) IMPLANT
SCREW 3.5 SELFDRILL 15MM VARI (Screw) IMPLANT
SOL ELECTROSURG ANTI STICK (MISCELLANEOUS)
SOLUTION ELECTROSURG ANTI STCK (MISCELLANEOUS) ×1 IMPLANT
SPIKE FLUID TRANSFER (MISCELLANEOUS) ×1 IMPLANT
SPONGE INTESTINAL PEANUT (DISPOSABLE) ×1 IMPLANT
SPONGE SURGIFOAM ABS GEL 100 (HEMOSTASIS) ×1 IMPLANT
STRIP CLOSURE SKIN 1/2X4 (GAUZE/BANDAGES/DRESSINGS) IMPLANT
SUT ETHILON 3 0 FSL (SUTURE) IMPLANT
SUT VIC AB 3-0 SH 8-18 (SUTURE) ×1 IMPLANT
SUT VICRYL 3-0 RB1 18 ABS (SUTURE) ×1 IMPLANT
TAPE CLOTH 3X10 TAN LF (GAUZE/BANDAGES/DRESSINGS) ×1 IMPLANT
TOWEL GREEN STERILE (TOWEL DISPOSABLE) ×1 IMPLANT
TOWEL GREEN STERILE FF (TOWEL DISPOSABLE) ×1 IMPLANT
TRAY CATH INTERMITTENT SS 16FR (CATHETERS) IMPLANT
WATER STERILE IRR 1000ML POUR (IV SOLUTION) ×1 IMPLANT

## 2022-12-17 NOTE — Anesthesia Postprocedure Evaluation (Signed)
Anesthesia Post Note  Patient: ALLYSON HOLZHAUSEN  Procedure(s) Performed: ANTERIOR CERVICAL DISCECTOMY AND FUSION CERVICAL FIVE-SIX/CERVICAL SIX-SEVEN     Patient location during evaluation: PACU Anesthesia Type: General Level of consciousness: awake and alert Pain management: pain level controlled Vital Signs Assessment: post-procedure vital signs reviewed and stable Respiratory status: spontaneous breathing, nonlabored ventilation, respiratory function stable and patient connected to nasal cannula oxygen Cardiovascular status: blood pressure returned to baseline and stable Postop Assessment: no apparent nausea or vomiting Anesthetic complications: no  No notable events documented.  Last Vitals:  Vitals:   12/17/22 1635 12/17/22 1650  BP: 121/79 (!) 128/92  Pulse: (!) 101 100  Resp: 17 14  Temp:  37.2 C  SpO2: 95% 94%    Last Pain:  Vitals:   12/17/22 1650  TempSrc:   PainSc: 0-No pain    LLE Motor Response: Purposeful movement (12/17/22 1650) LLE Sensation: Full sensation (12/17/22 1650) RLE Motor Response: Purposeful movement (12/17/22 1650) RLE Sensation: Full sensation (12/17/22 1650)      Keller Mikels S

## 2022-12-17 NOTE — H&P (Signed)
Chief Complaint   Neck and right arm pain  History of Present Illness  Joel Castillo is a 62 year old man seen in follow-up. 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 He has undergone a series of epidural steroid injections which unfortunately at this point are not providing any meaningful improvement. He has elected to proceed with ACDF.  To review, the patient reports a history of labile blood pressure, not currently on antihypertensive medication. No history of diabetes. Has a history of heart attack about six years ago. He reports having a stress test from his cardiologist about a year ago which was reportedly normal. No known lung, liver, or kidney disease. He does take a baby aspirin, no other anti-platelet or anticoagulation. He is a smoker.   Past Medical History   Past Medical History:  Diagnosis Date   Chronic abdominal pain    Chronic back pain    Chronic bronchitis    Chronic nausea    Complication of anesthesia 11/22/1998   sat O2 dropped during surgery   COPD (chronic obstructive pulmonary disease) (HCC)    Depression    GERD (gastroesophageal reflux disease)    H. pylori infection 03/05/2012   treated with prevpac   Hyperplastic colon polyp    Hypertension    Lumbago    Myocardial infarction ([place])    2018   Rectal bleeding    Shortness of breath    Sigmoid diverticulosis    Tubulovillous adenoma     Past Surgical History   Past Surgical History:  Procedure Laterality Date   BACK SURGERY  11/22/1998   COLONOSCOPY  03/24/12   Dr. Gala Romney- multiple rectal and colonic polyps= tubulovillous adenoma, tular adenoma and hyperplastic polyps ,  sigmoid diverticulosis, poor prep   ESOPHAGOGASTRODUODENOSCOPY  03/24/12   Dr. Gala Romney- patulous EG junction, moderate hiatal hernia, abnormal gastric mucosa, duodenal erosions, +hpylori   FINGER SURGERY      Social History   Social History   Tobacco Use   Smoking  status: Every Day    Packs/day: 0.50    Years: 25.00    Additional pack years: 0.00    Total pack years: 12.50    Types: Cigarettes   Smokeless tobacco: Never  Vaping Use   Vaping Use: Never used  Substance Use Topics   Alcohol use: No   Drug use: No    Medications   Prior to Admission medications   Medication Sig Start Date End Date Taking? Authorizing Provider  albuterol (PROVENTIL HFA;VENTOLIN HFA) 108 (90 BASE) MCG/ACT inhaler Inhale 1-2 puffs into the lungs every 6 (six) hours as needed for shortness of breath or wheezing.   Yes [provider]  aspirin EC 81 MG tablet Take 81 mg by mouth daily.   Yes [provider]  atorvastatin (LIPITOR) 40 MG tablet Take 40 mg by mouth daily with lunch.   Yes [provider]  cyclobenzaprine (FLEXERIL) 10 MG tablet Take 10 mg by mouth 2 (two) times daily as needed for muscle spasms.   Yes [provider]  mirtazapine (REMERON) 15 MG tablet Take 15 mg by mouth at bedtime. 07/07/22  Yes [provider]  naloxone (NARCAN) nasal spray 4 mg/0.1 mL Place 1 spray into the nose as needed (opioid overdose). 11/19/22  Yes [provider]  oxyCODONE-acetaminophen (PERCOCET) 10-325 MG tablet Take 1 tablet by mouth 4 (four) times daily as needed for pain.   Yes [provider]  topiramate (TOPAMAX) 100 MG tablet Take 100 mg by mouth daily with lunch.   Yes [provider]  Aspirin-Salicylamide-Caffeine (BC HEADACHE POWDER PO) Take 1 packet by mouth 2 (two) times daily as needed (headaches.).    [provider]  pregabalin (LYRICA) 200 MG capsule Take 200 mg by mouth every 8 (eight) hours.    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  Plan  - Will proceed with ACDF C5-6, C6-7  I have reviewed the indications for the procedure as well as the details of the procedure and the expected postoperative course and recovery at length with the patient in the office. We have also reviewed in detail the risks, benefits, and alternatives to the procedure. All questions were answered and Joel Castillo provided informed consent to proceed.  Consuella Lose, MD [facility]

## 2022-12-17 NOTE — Anesthesia Procedure Notes (Signed)
Procedure Name: Intubation Date/Time: 12/17/2022 1:05 PM  Performed by: Colin Benton, CRNAPre-anesthesia Checklist: Patient identified, Emergency Drugs available, Suction available and Patient being monitored Patient Re-evaluated:Patient Re-evaluated prior to induction Oxygen Delivery Method: Circle system utilized Preoxygenation: Pre-oxygenation with 100% oxygen Induction Type: IV induction Ventilation: Mask ventilation without difficulty and Oral airway inserted - appropriate to patient size Laryngoscope Size: Mac and 3 Grade View: Grade I Tube type: Oral Tube size: 7.5 mm Number of attempts: 1 Airway Equipment and Method: Stylet and Oral airway Placement Confirmation: ETT inserted through vocal cords under direct vision, positive ETCO2 and breath sounds checked- equal and bilateral Secured at: 24 cm Tube secured with: Tape Dental Injury: Teeth and Oropharynx as per pre-operative assessment  Comments: Intubation done by Mills Koller, EMT student under supervision of CRNA and MD.

## 2022-12-17 NOTE — Transfer of Care (Signed)
Immediate Anesthesia Transfer of Care Note  Patient: Joel Castillo  Procedure(s) Performed: ANTERIOR CERVICAL DISCECTOMY AND FUSION CERVICAL FIVE-SIX/CERVICAL SIX-SEVEN  Patient Location: PACU  Anesthesia Type:General  Level of Consciousness: drowsy  Airway & Oxygen Therapy: Patient Spontanous Breathing and Patient connected to face mask oxygen  Post-op Assessment: Report given to RN and Post -op Vital signs reviewed and stable  Post vital signs: Reviewed and stable  Last Vitals:  Vitals Value Taken Time  BP 120/74 12/17/22 1550  Temp 36.9 C 12/17/22 1550  Pulse 103 12/17/22 1553  Resp 19 12/17/22 1553  SpO2 94 % 12/17/22 1553  Vitals shown include unvalidated device data.  Last Pain:  Vitals:   12/17/22 1550  TempSrc:   PainSc: Asleep      Patients Stated Pain Goal: 0 ([id number])  Complications: No notable events documented.

## 2022-12-17 NOTE — Progress Notes (Signed)
Orthopedic Tech Progress Note Patient Details:  Joel Castillo 1961-08-01 [id number]  Called in order to [employer] for a [employer]   Patient ID: Joel Castillo, male   DOB: 04-09-1961, 62 y.o.   MRN: [id number]  Janit Pagan 12/17/2022, 8:08 PM

## 2022-12-18 ENCOUNTER — Encounter (HOSPITAL_COMMUNITY): Payer: Self-pay | Admitting: Neurosurgery

## 2022-12-18 DIAGNOSIS — M4722 Other spondylosis with radiculopathy, cervical region: Secondary | ICD-10-CM | POA: Diagnosis not present

## 2022-12-18 MED ORDER — ASPIRIN 81 MG PO TBEC: 81.0000 mg | DELAYED_RELEASE_TABLET | Freq: Every day | ORAL | 12 refills | Status: AC

## 2022-12-18 MED ORDER — OXYCODONE HCL 10 MG PO TABS: 10.0000 mg | ORAL_TABLET | Freq: Four times a day (QID) | ORAL | 0 refills | Status: AC | PRN

## 2022-12-18 MED ORDER — METHOCARBAMOL 500 MG PO TABS: 500.0000 mg | ORAL_TABLET | Freq: Four times a day (QID) | ORAL | 0 refills | Status: DC | PRN

## 2022-12-18 NOTE — Discharge Summary (Signed)
Physician Discharge Summary  Patient ID: Joel Castillo MRN: [id number] DOB/AGE: 1961-02-12 62 y.o.  Admit date: 12/17/2022 Discharge date: 12/18/2022  Admission Diagnoses:  Cervical spondylosis with radiculopathy  Discharge Diagnoses:  Same Principal Problem:   Cervical spondylosis with radiculopathy   Discharged Condition: Stable  Hospital Course:  Joel Castillo is a 62 y.o. male admitted after C5-6 C6-7 ACDF. HE was at baseline postop with significant improvement in preop right arm pain. He was tolerating diet, voiding normally, ambulating well.  Treatments: Surgery - ACDF C5-6 C6-7  Discharge Exam: Blood pressure 99/86, pulse 87, temperature 98.4 F (36.9 C), temperature source Oral, resp. rate 18, height 5\' 9"  (1.753 m), weight 68.9 kg, SpO2 94 %. Awake, alert, oriented Speech fluent, appropriate CN grossly intact 5/5 BUE/BLE Wound c/d/i  Disposition: Discharge disposition: 01-Home or Self Care       Discharge Instructions     Call MD for:  redness, tenderness, or signs of infection (pain, swelling, redness, odor or green/yellow discharge around incision site)   Complete by: As directed    Call MD for:  temperature >100.4   Complete by: As directed    Diet - low sodium heart healthy   Complete by: As directed    Discharge instructions   Complete by: As directed    Walk at home as much as possible, at least 4 times / day   Incentive spirometry RT   Complete by: As directed    Increase activity slowly   Complete by: As directed    Lifting restrictions   Complete by: As directed    No lifting > 10 lbs   May shower / Bathe   Complete by: As directed    48 hours after surgery   May walk up steps   Complete by: As directed    Other Restrictions   Complete by: As directed    No bending/twisting at waist   Remove dressing in 48 hours   Complete by: As directed       Allergies as of 12/18/2022   No Known Allergies      Medication List     STOP  taking these medications    BC HEADACHE POWDER PO       TAKE these medications    albuterol 108 (90 Base) MCG/ACT inhaler Commonly known as: VENTOLIN HFA Inhale 1-2 puffs into the lungs every 6 (six) hours as needed for shortness of breath or wheezing.   aspirin EC 81 MG tablet Take 1 tablet (81 mg total) by mouth daily. Start taking on: December 22, 2022 What changed: These instructions start on December 22, 2022. If you are unsure what to do until then, ask your doctor or other care provider.   atorvastatin 40 MG tablet Commonly known as: LIPITOR Take 40 mg by mouth daily with lunch.   cyclobenzaprine 10 MG tablet Commonly known as: FLEXERIL Take 10 mg by mouth 2 (two) times daily as needed for muscle spasms.   methocarbamol 500 MG tablet Commonly known as: ROBAXIN Take 1 tablet (500 mg total) by mouth every 6 (six) hours as needed for muscle spasms.   mirtazapine 15 MG tablet Commonly known as: REMERON Take 15 mg by mouth at bedtime.   naloxone 4 MG/0.1ML Liqd nasal spray kit Commonly known as: NARCAN Place 1 spray into the nose as needed (opioid overdose).   Oxycodone HCl 10 MG Tabs Take 1 tablet (10 mg total) by mouth every 6 (six) hours as needed for  up to 7 days for severe pain ((score 7 to 10)).   oxyCODONE-acetaminophen 10-325 MG tablet Commonly known as: PERCOCET Take 1 tablet by mouth 4 (four) times daily as needed for pain.   pregabalin 200 MG capsule Commonly known as: LYRICA Take 200 mg by mouth every 8 (eight) hours.   topiramate 100 MG tablet Commonly known as: TOPAMAX Take 100 mg by mouth daily with lunch.        Follow-up Information     Consuella Lose, MD Follow up.   Specialty: Neurosurgery Contact information: [address] [place] [address] [phone]                 Signed: Jairo Ben 12/18/2022, 8:10 AM

## 2022-12-18 NOTE — Op Note (Signed)
NEUROSURGERY OPERATIVE NOTE   PREOP DIAGNOSIS: Cervical Spondylosis with radiculopathy, C5-6, C6-7  POSTOP DIAGNOSIS: Same  PROCEDURE: 1. Discectomy at C5-6, C6-7 for decompression of spinal cord and exiting nerve roots  2. Placement of intervertebral biomechanical device, Medtronic titan 84mm medium width lordotic cage x2 3. Placement of anterior instrumentation consisting of interbody plate and screws - Medtronic Zevo 29mm plate x2, 624THL screws x8 4. Use of morselized bone allograft  5. Arthrodesis C5-6, C6-7, anterior interbody technique  6. Use of intraoperative microscope  SURGEON: Dr. Consuella Lose, MD  ASSISTANT: Dr. Newman Pies, MD  ANESTHESIA: General Endotracheal  EBL: Minimal  SPECIMENS: None  DRAINS: None  COMPLICATIONS: None immediate  CONDITION: Hemodynamically stable to PACU  HISTORY: Joel Castillo is a 62 y.o. who has been followed in the outpatient neurosurgery clinic complaining of relatively chronic severe right-sided neck and arm pain.  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  The patient attempted multiple different conservative treatments without lasting improvement in symptoms and therefore elected to proceed with surgical decompression and fusion.  The risks, benefits, and alternatives to surgery were all reviewed in detail with the patient.  After all his questions were answered informed consent was obtained and witnessed.  PROCEDURE IN DETAIL: The patient was brought to the operating room and transferred to the operative table. After induction of general anesthesia, the patient was positioned on the operative table in the supine position with all pressure points meticulously padded. The skin of the neck was then prepped and draped in the usual sterile fashion.  After timeout was conducted, the skin was infiltrated with local anesthetic.  Right-sided transverse skin incision was then made sharply and  Bovie electrocautery was used to dissect the subcutaneous tissue until the platysma was identified. The platysma was then divided and undermined. The sternocleidomastoid muscle was then identified and, utilizing natural fascial planes in the neck, the prevertebral fascia was identified and the carotid sheath was retracted laterally and the trachea and esophagus retracted medially. Again using fluoroscopy, the correct disc spaces were identified. Bovie electrocautery was used to dissect in the subperiosteal plane and elevate the bilateral longus coli muscles. Table mounted retractors were then placed. At this point, the microscope was draped and brought into the field, and the remainder of the case was done under the microscope using microdissecting technique.  The C5-6 disc space was incised sharply and rongeurs were use to initially complete a discectomy. The high-speed drill was then used to complete discectomy until the posterior annulus was identified and removed and the posterior longitudinal ligament was identified. Using a nerve hook, the PLL was elevated, and Kerrison rongeurs were used to remove the posterior longitudinal ligament and the ventral thecal sac was identified. Using a combination of curettes and rongeurs, complete decompression of the thecal sac and exiting nerve roots at this level was completed, and verified using micro-nerve hook.  This includes removal of the uncovertebral joints in order to fully decompress the exiting nerve roots.  At this point, a 6 mm tall, medium width lordotic interbody cage was sized and packed with morcellized bone allograft. This was then inserted and tapped into place flush with the anterior vertebral body.  A 17 mm plate was then placed across the C5-6 interspace and secured with 15 mm screws.  Attention was then turned to the C6-7 level. In a similar fashion, discectomy was completed initially with curettes and rongeurs, and completed with the drill. The  PLL was again identified, elevated and incised.  Decompression of the thecal sac and exiting nerve roots was again completed in similar fashion including removal of the uncovertebral joints in order to fully decompress the C7 nerve roots.  A 6 mm medium with lordotic interbody cage was then sized and filled with bone allograft, and tapped into place.  Another 17 mm plate was selected and placed across the interspace and carried with 15 mm screws in C6 and C7.  Final lateral fluoroscopic image was taken to confirm good location of the implanted hardware and good cervical alignment.  At this point, after all counts were verified to be correct, meticulous hemostasis was secured using a combination of bipolar electrocautery and passive hemostatics. The platysma muscle was then closed using interrupted 3-0 Vicryl sutures, and the skin was closed with a interrupted subcuticular stitch. Sterile dressings were then applied and the drapes removed.  The patient tolerated the procedure well and was extubated in the room and taken to the postanesthesia care unit in stable condition.   Consuella Lose, MD [facility]

## 2022-12-18 NOTE — Evaluation (Signed)
Occupational Therapy Evaluation Patient Details Name: Joel Castillo MRN: [id number] DOB: [birth year] Today's Date: 12/18/2022   History of Present Illness Pt is a 62 y.o. male s/p ACDF C5-7. PMH significant for COPD, GERD, HTN, lumbago, MI (2016), back surgery.   Clinical Impression   PTA, pt lived alone and was independent. Sister and nephew who live in houses next to pt available to assist as needed at discharge. Upon eval, pt performing ADL at supervision to mod I level. Pt educated and demonstrating use of compensatory techniques for bed mobility, LB ADL, UB ADL, brace application and cleaning/pad replacement, toiletig, shower transfers, and grooming within precautions. Recommending discharge home with family to assist as needed. No further OT needs identified at this time. OT to sign off. Thank you for this order.      Recommendations for follow up therapy are one component of a multi-disciplinary discharge planning process, led by the attending physician.  Recommendations may be updated based on patient status, additional functional criteria and insurance authorization.   Follow Up Recommendations  No OT follow up     Assistance Recommended at Discharge Intermittent Supervision/Assistance  Patient can return home with the following Help with stairs or ramp for entrance;Assist for transportation;Assistance with cooking/housework;A little help with bathing/dressing/bathroom    Functional Status Assessment  Patient has had a recent decline in their functional status and demonstrates the ability to make significant improvements in function in a reasonable and predictable amount of time.  Equipment Recommendations  BSC/3in1    Recommendations for Other Services       Precautions / Restrictions Precautions Precautions: Cervical Precaution Booklet Issued: Yes (comment) Precaution Comments: All precautions reviewed during ADL Required Braces or Orthoses: Cervical Brace Cervical  Brace: Hard collar (may remove to shower and may ambulate to restroom without brace) Restrictions Weight Bearing Restrictions: No      Mobility Bed Mobility Overal bed mobility: Modified Independent             General bed mobility comments: good use of log roll technique    Transfers Overall transfer level: Modified independent Equipment used: None               General transfer comment: Mod I for STS transfers.      Balance Overall balance assessment: Mild deficits observed, not formally tested                                         ADL either performed or assessed with clinical judgement   ADL Overall ADL's : Needs assistance/impaired Eating/Feeding: Independent;Bed level Eating/Feeding Details (indicate cue type and reason): finishing breakfast on arrival Grooming: Standing;Modified independent Grooming Details (indicate cue type and reason): reviewed compensatory techniques Upper Body Bathing: Sitting;Modified independent   Lower Body Bathing: Modified independent;Sit to/from stand   Upper Body Dressing : Supervision/safety;Standing Upper Body Dressing Details (indicate cue type and reason): for new learning to don brace Lower Body Dressing: Modified independent;Sit to/from stand Lower Body Dressing Details (indicate cue type and reason): good use of compensatory techniques Toilet Transfer: Supervision/safety;Ambulation;Regular Glass blower/designer Details (indicate cue type and reason): mild unsteadiness initially Toileting- Clothing Manipulation and Hygiene: Modified independent   Tub/ Banker: Min guard;Ambulation;Tub transfer;BSC/3in1;Grab bars Tub/Shower Transfer Details (indicate cue type and reason): Min guard A for safety. Poor balance with elevation of LE Functional mobility during ADLs: Supervision/safety General ADL Comments: approaching mod  I for mobility     Vision Baseline Vision/History: 0 No visual  deficits Ability to See in Adequate Light: 0 Adequate Patient Visual Report: No change from baseline Vision Assessment?: No apparent visual deficits     Perception     Praxis      Pertinent Vitals/Pain Pain Assessment Pain Assessment: Faces Faces Pain Scale: Hurts little more Pain Location: R arm, operative site Pain Descriptors / Indicators: Aching, Discomfort, Operative site guarding, Sore Pain Intervention(s): Limited activity within patient's tolerance, Monitored during session     Hand Dominance Right   Extremity/Trunk Assessment Upper Extremity Assessment Upper Extremity Assessment: Generalized weakness;RUE deficits/detail RUE Deficits / Details: generalized weakness bilaterally, however, RUE with decr sensation as compared to L. pt with 4/-5 strength. RUE Sensation: decreased light touch RUE Coordination: decreased gross motor (slowed fine motor)   Lower Extremity Assessment Lower Extremity Assessment: Defer to PT evaluation   Cervical / Trunk Assessment Cervical / Trunk Assessment: Neck Surgery;Back Surgery   Communication Communication Communication: No difficulties   Cognition Arousal/Alertness: Awake/alert Behavior During Therapy: WFL for tasks assessed/performed Overall Cognitive Status: Within Functional Limits for tasks assessed                                       General Comments  VSS    Exercises     Shoulder Instructions      Home Living Family/patient expects to be discharged to:: Private residence Living Arrangements: Alone Available Help at Discharge: Family;Available PRN/intermittently Type of Home: Mobile home Home Access: Stairs to enter Entrance Stairs-Number of Steps: 5 in front; 4 in back Entrance Stairs-Rails: Left (at front) Home Layout: One level     Bathroom Shower/Tub: Teacher, early years/pre: Standard     Home Equipment: Cane - single point;Grab bars - tub/shower;Grab bars - toilet           Prior Functioning/Environment Prior Level of Function : Independent/Modified Independent             Mobility Comments: occasional use of cane ADLs Comments: indep with ADL and IADL except for driving (nephew gives him a ride as needed)        OT Problem List: Decreased strength;Decreased activity tolerance;Decreased range of motion;Impaired balance (sitting and/or standing);Decreased knowledge of use of DME or AE;Decreased knowledge of precautions;Impaired UE functional use;Pain      OT Treatment/Interventions:      OT Goals(Current goals can be found in the care plan section) Acute Rehab OT Goals Patient Stated Goal: decreased RUE pain OT Goal Formulation: With patient  OT Frequency:      Co-evaluation              AM-PAC OT "6 Clicks" Daily Activity     Outcome Measure Help from another person eating meals?: None Help from another person taking care of personal grooming?: None Help from another person toileting, which includes using toliet, bedpan, or urinal?: A Little Help from another person bathing (including washing, rinsing, drying)?: A Little Help from another person to put on and taking off regular upper body clothing?: A Little Help from another person to put on and taking off regular lower body clothing?: None 6 Click Score: 21   End of Session Equipment Utilized During Treatment: Cervical collar Nurse Communication: Mobility status  Activity Tolerance: Patient tolerated treatment well Patient left: in bed;with call bell/phone within reach;Other (comment) (PT in  room)  OT Visit Diagnosis: Unsteadiness on feet (R26.81);Muscle weakness (generalized) (M62.81);Other abnormalities of gait and mobility (R26.89);Pain Pain - Right/Left: Right Pain - part of body: Arm                Time: [id number] OT Time Calculation (min): 25 min Charges:  OT General Charges $OT Visit: 1 Visit OT Evaluation $OT Eval Low Complexity: 1 Low OT Treatments $Self  Care/Home Management : 8-22 mins  Elder Cyphers, OTR/L [facility] Acute Rehabilitation Office: [phone]  Joel Castillo 12/18/2022, 8:33 AM

## 2022-12-18 NOTE — Plan of Care (Signed)
  Problem: Education: Goal: Ability to verbalize activity precautions or restrictions will improve Outcome: Completed/Met Goal: Knowledge of the prescribed therapeutic regimen will improve Outcome: Completed/Met Goal: Understanding of discharge needs will improve Outcome: Completed/Met   Problem: Activity: Goal: Ability to avoid complications of mobility impairment will improve Outcome: Completed/Met Goal: Ability to tolerate increased activity will improve Outcome: Completed/Met Goal: Will remain free from falls Outcome: Completed/Met   Problem: Bowel/Gastric: Goal: Gastrointestinal status for postoperative course will improve Outcome: Completed/Met   Problem: Clinical Measurements: Goal: Ability to maintain clinical measurements within normal limits will improve Outcome: Completed/Met Goal: Postoperative complications will be avoided or minimized Outcome: Completed/Met Goal: Diagnostic test results will improve Outcome: Completed/Met   Problem: Pain Management: Goal: Pain level will decrease Outcome: Completed/Met   Problem: Skin Integrity: Goal: Will show signs of wound healing Outcome: Completed/Met   Problem: Health Behavior/Discharge Planning: Goal: Identification of resources available to assist in meeting health care needs will improve Outcome: Completed/Met   Problem: Bladder/Genitourinary: Goal: Urinary functional status for postoperative course will improve Outcome: Completed/Met  Patient alert and oriented, void, VSS, ambulate. Surgical site clean and dry. D/c instructions explain and given to the patient. Patient d/c home per order.

## 2022-12-18 NOTE — Evaluation (Signed)
Physical Therapy Evaluation Patient Details Name: Joel Castillo MRN: [id number] DOB: 14-Aug-1961 Today's Date: 12/18/2022  History of Present Illness  Pt is a 62 y.o. male s/p ACDF C5-7. PMH significant for COPD, GERD, HTN, lumbago, MI (2016), back surgery.  Clinical Impression  Pt in bed upon arrival of PT, agreeable to evaluation at this time. Prior to admission the pt was ambulating with intermittent use of cane, reports intermittent stumbling but no significant falls. The pt now presents with minor limitations in functional mobility, LE power, endurance, and dynamic stability due to above dx, and will continue to benefit from skilled PT to address these deficits. The pt was able to demo good independence with sit-stand transfers without need for assist or DME, and was able to demo good distance ambulation in hallway and navigation of 5 stairs without LOB or assist. Discussed continued progression of gait to improve endurance and maintain strength as well as other exercises for LE pt can continue after d/c. Pt verbalizes good understanding of education regarding precautions and brace, no follow-up PT needs.   Gait Speed: 0.83m/s (Gait speed <0.35m/s indicates increased risk of falls and dependence in ADLs)        Recommendations for follow up therapy are one component of a multi-disciplinary discharge planning process, led by the attending physician.  Recommendations may be updated based on patient status, additional functional criteria and insurance authorization.  Follow Up Recommendations No PT follow up      Assistance Recommended at Discharge Intermittent Supervision/Assistance  Patient can return home with the following  Assistance with cooking/housework;Assist for transportation;Help with stairs or ramp for entrance    Equipment Recommendations None recommended by PT  Recommendations for Other Services       Functional Status Assessment Patient has had a recent decline in their  functional status and demonstrates the ability to make significant improvements in function in a reasonable and predictable amount of time.     Precautions / Restrictions Precautions Precautions: Cervical Precaution Booklet Issued: Yes (comment) Precaution Comments: All precautions reviewed during ADL Required Braces or Orthoses: Cervical Brace Cervical Brace: Hard collar (may remove to shower and may ambulate to restroom without brace) Restrictions Weight Bearing Restrictions: No      Mobility  Bed Mobility               General bed mobility comments: pt sitting EOB at start and end of session. states no questions about log roll    Transfers Overall transfer level: Modified independent Equipment used: None               General transfer comment: Mod I for STS transfers.    Ambulation/Gait Ambulation/Gait assistance: Min guard Gait Distance (Feet): 300 Feet Assistive device: None Gait Pattern/deviations: Step-through pattern, Decreased stride length Gait velocity: 0.42 m/s Gait velocity interpretation: 1.31 - 2.62 ft/sec, indicative of limited community ambulator   General Gait Details: slow gait with minimal step clearance. pt stopping to cough multiple times. no overt buckling or LOB  Stairs Stairs: Yes Stairs assistance: Supervision Stair Management: One rail Left, Alternating pattern, Forwards Number of Stairs: 6 General stair comments: stable with UE support, slow     Balance Overall balance assessment: Mild deficits observed, not formally tested  Pertinent Vitals/Pain Pain Assessment Pain Assessment: Faces Faces Pain Scale: Hurts little more Pain Location: R arm, operative site Pain Descriptors / Indicators: Aching, Discomfort, Operative site guarding, Sore Pain Intervention(s): Monitored during session, Repositioned, Limited activity within patient's tolerance    Home Living  Family/patient expects to be discharged to:: Private residence Living Arrangements: Alone Available Help at Discharge: Family;Available PRN/intermittently Type of Home: Mobile home Home Access: Stairs to enter Entrance Stairs-Rails: Left (at front) Entrance Stairs-Number of Steps: 5 in front; 4 in back   Home Layout: One level Home Equipment: Cane - single point;Grab bars - tub/shower;Grab bars - toilet      Prior Function Prior Level of Function : Independent/Modified Independent             Mobility Comments: occasional use of cane ADLs Comments: indep with ADL and IADL except for driving (nephew gives him a ride as needed)     Hand Dominance   Dominant Hand: Right    Extremity/Trunk Assessment   Upper Extremity Assessment Upper Extremity Assessment: Defer to OT evaluation;RUE deficits/detail RUE Deficits / Details: generalized weakness bilaterally, however, RUE with decr sensation as compared to L. pt with 4/-5 strength. RUE Sensation: decreased light touch RUE Coordination: decreased gross motor (slowed fine motor)    Lower Extremity Assessment Lower Extremity Assessment: Generalized weakness (no focal deficits or reports of numbness in LE. pt does report 2-4th toes on R foot have caused him pain for last few years after he hit them on a chair.)    Cervical / Trunk Assessment Cervical / Trunk Assessment: Neck Surgery  Communication   Communication: No difficulties  Cognition Arousal/Alertness: Awake/alert Behavior During Therapy: WFL for tasks assessed/performed Overall Cognitive Status: Within Functional Limits for tasks assessed                                          General Comments General comments (skin integrity, edema, etc.): VSS    Exercises Other Exercises Other Exercises: discussed progressive walking program, seated calf raises, and repeated sit-stand transfers for continued LE strengthening   Assessment/Plan    PT  Assessment Patient needs continued PT services  PT Problem List Decreased strength;Decreased balance       PT Treatment Interventions Gait training;Functional mobility training;Therapeutic exercise    PT Goals (Current goals can be found in the Care Plan section)  Acute Rehab PT Goals Patient Stated Goal: reduced pain in RUE PT Goal Formulation: With patient Time For Goal Achievement: 01/01/23 Potential to Achieve Goals: Good    Frequency Min 5X/week        AM-PAC PT "6 Clicks" Mobility  Outcome Measure Help needed turning from your back to your side while in a flat bed without using bedrails?: None Help needed moving from lying on your back to sitting on the side of a flat bed without using bedrails?: None Help needed moving to and from a bed to a chair (including a wheelchair)?: A Little Help needed standing up from a chair using your arms (e.g., wheelchair or bedside chair)?: A Little Help needed to walk in hospital room?: A Little Help needed climbing 3-5 steps with a railing? : A Little 6 Click Score: 20    End of Session Equipment Utilized During Treatment: Cervical collar Activity Tolerance: Patient tolerated treatment well Patient left: in bed;with call bell/phone within reach Nurse Communication: Mobility status PT Visit Diagnosis:  Unsteadiness on feet (R26.81);Muscle weakness (generalized) (M62.81)    Time: [id number] PT Time Calculation (min) (ACUTE ONLY): 24 min   Charges:   PT Evaluation $PT Eval Low Complexity: 1 Low PT Treatments $Therapeutic Exercise: 8-22 mins        West Carbo, PT, DPT   Acute Rehabilitation Department Office [place] Communication Preferred  Sandra Cockayne 12/18/2022, 9:45 AM

## 2023-04-05 ENCOUNTER — Ambulatory Visit (HOSPITAL_COMMUNITY): Payer: Medicaid Other | Attending: Orthopaedic Surgery | Admitting: Occupational Therapy

## 2023-04-05 ENCOUNTER — Encounter (HOSPITAL_COMMUNITY): Payer: Self-pay | Admitting: Occupational Therapy

## 2023-04-05 ENCOUNTER — Other Ambulatory Visit: Payer: Self-pay

## 2023-04-05 DIAGNOSIS — R29898 Other symptoms and signs involving the musculoskeletal system: Secondary | ICD-10-CM | POA: Insufficient documentation

## 2023-04-05 DIAGNOSIS — M25511 Pain in right shoulder: Secondary | ICD-10-CM | POA: Diagnosis present

## 2023-04-05 DIAGNOSIS — M25611 Stiffness of right shoulder, not elsewhere classified: Secondary | ICD-10-CM | POA: Insufficient documentation

## 2023-04-05 NOTE — Therapy (Signed)
OUTPATIENT OCCUPATIONAL THERAPY ORTHO EVALUATION  Patient Name: Joel Castillo MRN: [id number] DOB:August 06, 1961, 62 y.o., male Today's Date: 04/06/2023  PCP: Seward Carol, NP REFERRING PROVIDER: Ramond Marrow, MD  END OF SESSION:  OT End of Session - 04/06/23 2112     Visit Number 1    Number of Visits 9    Date for OT Re-Evaluation 05/14/23    Authorization Type [employer] Medicaid    Authorization Time Period 27 visits    Authorization - Number of Visits 27    Progress Note Due on Visit 10    OT Start Time 1035    OT Stop Time 1109    OT Time Calculation (min) 34 min    Activity Tolerance Patient tolerated treatment well    Behavior During Therapy WFL for tasks assessed/performed             Past Medical History:  Diagnosis Date   Chronic abdominal pain    Chronic back pain    Chronic bronchitis    Chronic nausea    Complication of anesthesia 11/22/1998   sat O2 dropped during surgery   COPD (chronic obstructive pulmonary disease) (HCC)    Depression    GERD (gastroesophageal reflux disease)    H. pylori infection 03/05/2012   treated with prevpac   Hyperplastic colon polyp    Hypertension    Lumbago    Myocardial infarction (HCC)    2018   Rectal bleeding    Shortness of breath    Sigmoid diverticulosis    Tubulovillous adenoma    Past Surgical History:  Procedure Laterality Date   ANTERIOR CERVICAL DECOMP/DISCECTOMY FUSION N/A 12/17/2022   Procedure: ANTERIOR CERVICAL DISCECTOMY AND FUSION CERVICAL FIVE-SIX/CERVICAL SIX-SEVEN;  Surgeon: Lisbeth Renshaw, MD;  Location: [facility] OR;  Service: Neurosurgery;  Laterality: N/A;   BACK SURGERY  11/22/1998   COLONOSCOPY  03/24/12   Dr. Jena Gauss- multiple rectal and colonic polyps= tubulovillous adenoma, tular adenoma and hyperplastic polyps ,  sigmoid diverticulosis, poor prep   ESOPHAGOGASTRODUODENOSCOPY  03/24/12   Dr. Jena Gauss- patulous EG junction, moderate hiatal hernia, abnormal gastric mucosa, duodenal erosions, +hpylori    FINGER SURGERY     Patient Active Problem List   Diagnosis Date Noted   Cervical spondylosis with radiculopathy 12/17/2022   Rectal bleeding 02/25/2012   FH: colon cancer 02/25/2012   Epigastric pain 02/25/2012   GERD (gastroesophageal reflux disease) 02/25/2012   Chest pain 02/25/2012    ONSET DATE: ~4 years  REFERRING DIAG: Right Shoulder Tear  THERAPY DIAG:  Right shoulder pain, unspecified chronicity  Shoulder stiffness, right  Other symptoms and signs involving the musculoskeletal system  Rationale for Evaluation and Treatment: Rehabilitation  SUBJECTIVE:   SUBJECTIVE STATEMENT: "I have pain all the time, but my shoulder is a 10/10." Pt accompanied by: self  PERTINENT HISTORY: No past medical history on file. Pt reporting pain for the past 4 years with intermittent and worsening pain in his right shoulder.  PRECAUTIONS: None  WEIGHT BEARING RESTRICTIONS: No  PAIN:  Are you having pain? Yes: NPRS scale: 10/10 Pain location: anterior shoulder girdle Pain description: sharp and aching Aggravating factors: movement and constant Relieving factors: nothing  FALLS: Has patient fallen in last 6 months? No  LIVING ENVIRONMENT: Lives with: lives alone Lives in: Mobile home  PLOF: Independent  PATIENT GOALS: To get out of pain  NEXT MD VISIT: unsure  OBJECTIVE:   HAND DOMINANCE: Right  ADLs: Overall ADLs: Severe pain with dressing and bathing, complete's pericare  with LUE. typically tries not to use RUE for cooking and cleaning, unable to lift objects due to pain.   FUNCTIONAL OUTCOME MEASURES: Upper Extremity Functional Scale (UEFS): 71.3%  UPPER EXTREMITY ROM:     Active ROM Right eval  Shoulder flexion 62  Shoulder abduction 59  Shoulder internal rotation 90  Shoulder external rotation 15  (Blank rows = not tested)   UPPER EXTREMITY MMT:     MMT Right eval  Shoulder flexion 4/5  Shoulder abduction 3+/5  Shoulder adduction 3/5  Shoulder  extension 4-/5  Shoulder internal rotation 3/5  Shoulder external rotation 3+/5  (Blank rows = not tested)  HAND FUNCTION: Grip strength: Right: 54 lbs; Left: 90 lbs  SENSATION: Pt reports tingly sensation along entire RUE  OBSERVATIONS: moderate fascial restrictions along the biceps, trapezius, and anterior shoulder girdle.   TODAY'S TREATMENT:                                                                                                                              DATE: 04/05/23 : Evaluation Only   PATIENT EDUCATION: Education details: Will start on first treatment session Person educated: Patient Education method: Explanation and Demonstration Education comprehension: verbalized understanding  HOME EXERCISE PROGRAM: Will start on first session  GOALS: Goals reviewed with patient? Yes  SHORT TERM GOALS: Target date: 05/14/23  Pt will be provided and educated on HEP to improve RUE mobility for ADL completion.   Goal status: INITIAL  2.  Pt will decrease pain to 2/10 in RUE in order to sleep 2+ consecutive hours without waking due to pain.   Goal status: INITIAL  3.  Pt will decrease RUE fascial restrictions to minimal amounts in order to complete reaching tasks with least restrictions/pulling sensation  Goal status: INITIAL  4.  Pt will increase ROM of RUE to [facility] limits in order to reach overhead and behind back during dressing and bathing tasks.   Goal status: INITIAL  5.  Pt will increase strength of RUE to 5/5 in order to complete lifting tasks required during yard work, cooking, and cleaning tasks.   Goal status: INITIAL  ASSESSMENT:  CLINICAL IMPRESSION: Patient is a 62 y.o. male who was seen today for occupational therapy evaluation for R shoulder pain which has lasted 4+ years. MRI indicates a tear in the rotator cuff.   PERFORMANCE DEFICITS: in functional skills including ADLs, IADLs, ROM, strength, pain, fascial restrictions, muscle spasms, Gross motor  control, body mechanics, and UE functional use.  IMPAIRMENTS: are limiting patient from ADLs, IADLs, rest and sleep, work, leisure, and social participation.   COMORBIDITIES: has no other co-morbidities that affects occupational performance. Patient will benefit from skilled OT to address above impairments and improve overall function.  MODIFICATION OR ASSISTANCE TO COMPLETE EVALUATION: No modification of tasks or assist necessary to complete an evaluation.  OT OCCUPATIONAL PROFILE AND HISTORY: Problem focused assessment: Including review of records relating to presenting problem.  CLINICAL DECISION MAKING: LOW - limited treatment options, no task modification necessary  REHAB POTENTIAL: Good  EVALUATION COMPLEXITY: Low      PLAN:  OT FREQUENCY: 2x/week  OT DURATION: 4 weeks  PLANNED INTERVENTIONS: self care/ADL training, therapeutic exercise, therapeutic activity, manual therapy, passive range of motion, functional mobility training, electrical stimulation, moist heat, patient/family education, coping strategies training, and DME and/or AE instructions  RECOMMENDED OTHER SERVICES: N/a  CONSULTED AND AGREED WITH PLAN OF CARE: Patient  PLAN FOR NEXT SESSION: Manual Therapy, P/ROM, Isometrics, trial AA/ROM, wall slides, ball rolls   Trish Mage, OTR/L [facility] [phone] Kennyth Arnold, OT 04/06/2023, 9:14 PM

## 2023-04-15 ENCOUNTER — Encounter (HOSPITAL_COMMUNITY): Payer: Self-pay | Admitting: Occupational Therapy

## 2023-04-15 ENCOUNTER — Ambulatory Visit (HOSPITAL_COMMUNITY): Payer: Medicaid Other | Admitting: Occupational Therapy

## 2023-04-15 DIAGNOSIS — M25511 Pain in right shoulder: Secondary | ICD-10-CM

## 2023-04-15 DIAGNOSIS — R29898 Other symptoms and signs involving the musculoskeletal system: Secondary | ICD-10-CM

## 2023-04-15 DIAGNOSIS — M25611 Stiffness of right shoulder, not elsewhere classified: Secondary | ICD-10-CM

## 2023-04-15 NOTE — Patient Instructions (Signed)

## 2023-04-15 NOTE — Therapy (Signed)
OUTPATIENT OCCUPATIONAL THERAPY ORTHO TREATMENT NOTE  Patient Name: Joel Castillo MRN: [id number] DOB:1961-06-13, 62 y.o., male Today's Date: 04/16/2023  PCP: Seward Carol, NP REFERRING PROVIDER: Ramond Marrow, MD  END OF SESSION:  OT End of Session - 04/15/23 1600     Visit Number 2    Number of Visits 9    Date for OT Re-Evaluation 05/14/23    Authorization Type [employer] Medicaid    Authorization Time Period 27 visits    Authorization - Visit Number 1    Authorization - Number of Visits 27    Progress Note Due on Visit 10    OT Start Time 1518    OT Stop Time 1600    OT Time Calculation (min) 42 min    Activity Tolerance Patient tolerated treatment well    Behavior During Therapy WFL for tasks assessed/performed            Past Medical History:  Diagnosis Date   Chronic abdominal pain    Chronic back pain    Chronic bronchitis    Chronic nausea    Complication of anesthesia 11/22/1998   sat O2 dropped during surgery   COPD (chronic obstructive pulmonary disease) (HCC)    Depression    GERD (gastroesophageal reflux disease)    H. pylori infection 03/05/2012   treated with prevpac   Hyperplastic colon polyp    Hypertension    Lumbago    Myocardial infarction (HCC)    2018   Rectal bleeding    Shortness of breath    Sigmoid diverticulosis    Tubulovillous adenoma    Past Surgical History:  Procedure Laterality Date   ANTERIOR CERVICAL DECOMP/DISCECTOMY FUSION N/A 12/17/2022   Procedure: ANTERIOR CERVICAL DISCECTOMY AND FUSION CERVICAL FIVE-SIX/CERVICAL SIX-SEVEN;  Surgeon: Lisbeth Renshaw, MD;  Location: [facility] OR;  Service: Neurosurgery;  Laterality: N/A;   BACK SURGERY  11/22/1998   COLONOSCOPY  03/24/12   Dr. Jena Gauss- multiple rectal and colonic polyps= tubulovillous adenoma, tular adenoma and hyperplastic polyps ,  sigmoid diverticulosis, poor prep   ESOPHAGOGASTRODUODENOSCOPY  03/24/12   Dr. Jena Gauss- patulous EG junction, moderate hiatal hernia, abnormal gastric  mucosa, duodenal erosions, +hpylori   FINGER SURGERY     Patient Active Problem List   Diagnosis Date Noted   Cervical spondylosis with radiculopathy 12/17/2022   Rectal bleeding 02/25/2012   FH: colon cancer 02/25/2012   Epigastric pain 02/25/2012   GERD (gastroesophageal reflux disease) 02/25/2012   Chest pain 02/25/2012    ONSET DATE: ~4 years  REFERRING DIAG: Right Shoulder Tear  THERAPY DIAG:  Right shoulder pain, unspecified chronicity  Shoulder stiffness, right  Other symptoms and signs involving the musculoskeletal system  Rationale for Evaluation and Treatment: Rehabilitation  SUBJECTIVE:   SUBJECTIVE STATEMENT: "I just want to be able to move my arm better. " Pt accompanied by: self  PERTINENT HISTORY: No past medical history on file. Pt reporting pain for the past 4 years with intermittent and worsening pain in his right shoulder.  PRECAUTIONS: None  WEIGHT BEARING RESTRICTIONS: No  PAIN:  Are you having pain? Yes: NPRS scale: 7/10 Pain location: anterior shoulder girdle Pain description: sharp and aching Aggravating factors: movement and constant Relieving factors: nothing  FALLS: Has patient fallen in last 6 months? No  LIVING ENVIRONMENT: Lives with: lives alone Lives in: Mobile home  PLOF: Independent  PATIENT GOALS: To get out of pain  NEXT MD VISIT: unsure  OBJECTIVE:   HAND DOMINANCE: Right  ADLs: Overall ADLs:  Severe pain with dressing and bathing, complete's pericare with LUE. typically tries not to use RUE for cooking and cleaning, unable to lift objects due to pain.   FUNCTIONAL OUTCOME MEASURES: Upper Extremity Functional Scale (UEFS): 71.3%  UPPER EXTREMITY ROM:     Active ROM Right eval  Shoulder flexion 62  Shoulder abduction 59  Shoulder internal rotation 90  Shoulder external rotation 15  (Blank rows = not tested)   UPPER EXTREMITY MMT:     MMT Right eval  Shoulder flexion 4/5  Shoulder abduction 3+/5   Shoulder adduction 3/5  Shoulder extension 4-/5  Shoulder internal rotation 3/5  Shoulder external rotation 3+/5  (Blank rows = not tested)  HAND FUNCTION: Grip strength: Right: 54 lbs; Left: 90 lbs  SENSATION: Pt reports tingly sensation along entire RUE  OBSERVATIONS: moderate fascial restrictions along the biceps, trapezius, and anterior shoulder girdle.   TODAY'S TREATMENT:                                                                                                                              DATE:   04/15/23 -Manual Therapy: myofascial release and trigger point applied to biceps, scapular region, trapzius, and deltoid, in order to address fascial restrictions and pain to improve ROM.  -P/ROM: flexion, abduction, horizontal abduction, er/IR, x12 -AA/ROM: flexion, abduction, protraction, horizontal abduciton, er/IR, x10 -Ball Rolls: flexion, abduction, x10 -Pulleys: flexion, abduction, x60"   PATIENT EDUCATION: Education details: AA/ROM Person educated: Patient Education method: Medical illustrator Education comprehension: verbalized understanding  HOME EXERCISE PROGRAM: 7/11: AA/ROM  GOALS: Goals reviewed with patient? Yes  SHORT TERM GOALS: Target date: 05/14/23  Pt will be provided and educated on HEP to improve RUE mobility for ADL completion.   Goal status: IN PROGRESS  2.  Pt will decrease pain to 2/10 in RUE in order to sleep 2+ consecutive hours without waking due to pain.   Goal status: IN PROGRESS  3.  Pt will decrease RUE fascial restrictions to minimal amounts in order to complete reaching tasks with least restrictions/pulling sensation  Goal status: IN PROGRESS  4.  Pt will increase ROM of RUE to [facility] limits in order to reach overhead and behind back during dressing and bathing tasks.   Goal status: IN PROGRESS  5.  Pt will increase strength of RUE to 5/5 in order to complete lifting tasks required during yard work, cooking, and  cleaning tasks.   Goal status: IN PROGRESS  ASSESSMENT:  CLINICAL IMPRESSION: This session, pt continuing to demonstrate increased pain and difficulty with ROM. He is able to achieve 60% with P/ROM and approximately 65% of full ROM with AA/ROM. He has pain with all stretching tasks to improve ROM this session, but is able to push through with minimal breaks. OT providing verbal and visual cuing throughout for positioning and technique.   PERFORMANCE DEFICITS: in functional skills including ADLs, IADLs, ROM, strength, pain, fascial restrictions, muscle spasms, Gross motor  control, body mechanics, and UE functional use.   PLAN:  OT FREQUENCY: 2x/week  OT DURATION: 4 weeks  PLANNED INTERVENTIONS: self care/ADL training, therapeutic exercise, therapeutic activity, manual therapy, passive range of motion, functional mobility training, electrical stimulation, moist heat, patient/family education, coping strategies training, and DME and/or AE instructions  RECOMMENDED OTHER SERVICES: N/a  CONSULTED AND AGREED WITH PLAN OF CARE: Patient  PLAN FOR NEXT SESSION: Manual Therapy, P/ROM, Isometrics, AA/ROM, wall slides, ball rolls   Trish Mage, OTR/L [facility] [phone] Kennyth Arnold, OT 04/16/2023, 9:34 AM

## 2023-04-16 ENCOUNTER — Ambulatory Visit (HOSPITAL_COMMUNITY): Payer: Medicaid Other | Admitting: Occupational Therapy

## 2023-04-16 ENCOUNTER — Encounter (HOSPITAL_COMMUNITY): Payer: Self-pay | Admitting: Occupational Therapy

## 2023-04-16 DIAGNOSIS — R29898 Other symptoms and signs involving the musculoskeletal system: Secondary | ICD-10-CM

## 2023-04-16 DIAGNOSIS — M25511 Pain in right shoulder: Secondary | ICD-10-CM

## 2023-04-16 DIAGNOSIS — M25611 Stiffness of right shoulder, not elsewhere classified: Secondary | ICD-10-CM

## 2023-04-16 NOTE — Therapy (Signed)
OUTPATIENT OCCUPATIONAL THERAPY ORTHO TREATMENT NOTE  Patient Name: ABDALLAH LEDGERWOOD MRN: [id number] DOB:[birth year], 62 y.o., male Today's Date: 04/16/2023  PCP: Seward Carol, NP REFERRING PROVIDER: Ramond Marrow, MD  END OF SESSION:  OT End of Session - 04/16/23 1424     Visit Number 3    Number of Visits 9    Date for OT Re-Evaluation 05/14/23    Authorization Type [employer]    Authorization Time Period 27 visits    Authorization - Visit Number 2    Authorization - Number of Visits 27    Progress Note Due on Visit 10    OT Start Time 1345    OT Stop Time 1425    OT Time Calculation (min) 40 min    Activity Tolerance Patient tolerated treatment well    Behavior During Therapy WFL for tasks assessed/performed             Past Medical History:  Diagnosis Date   Chronic abdominal pain    Chronic back pain    Chronic bronchitis    Chronic nausea    Complication of anesthesia 11/22/1998   sat O2 dropped during surgery   COPD (chronic obstructive pulmonary disease) (HCC)    Depression    GERD (gastroesophageal reflux disease)    H. pylori infection 03/05/2012   treated with prevpac   Hyperplastic colon polyp    Hypertension    Lumbago    Myocardial infarction (HCC)    2018   Rectal bleeding    Shortness of breath    Sigmoid diverticulosis    Tubulovillous adenoma    Past Surgical History:  Procedure Laterality Date   ANTERIOR CERVICAL DECOMP/DISCECTOMY FUSION N/A 12/17/2022   Procedure: ANTERIOR CERVICAL DISCECTOMY AND FUSION CERVICAL FIVE-SIX/CERVICAL SIX-SEVEN;  Surgeon: Lisbeth Renshaw, MD;  Location: [facility] OR;  Service: Neurosurgery;  Laterality: N/A;   BACK SURGERY  11/22/1998   COLONOSCOPY  03/24/12   Dr. Jena Gauss- multiple rectal and colonic polyps= tubulovillous adenoma, tular adenoma and hyperplastic polyps ,  sigmoid diverticulosis, poor prep   ESOPHAGOGASTRODUODENOSCOPY  03/24/12   Dr. Jena Gauss- patulous EG junction, moderate hiatal hernia, abnormal gastric  mucosa, duodenal erosions, +hpylori   FINGER SURGERY     Patient Active Problem List   Diagnosis Date Noted   Cervical spondylosis with radiculopathy 12/17/2022   Rectal bleeding 02/25/2012   FH: colon cancer 02/25/2012   Epigastric pain 02/25/2012   GERD (gastroesophageal reflux disease) 02/25/2012   Chest pain 02/25/2012    ONSET DATE: ~4 years  REFERRING DIAG: Right Shoulder Tear  THERAPY DIAG:  Right shoulder pain, unspecified chronicity  Shoulder stiffness, right  Other symptoms and signs involving the musculoskeletal system  Rationale for Evaluation and Treatment: Rehabilitation  SUBJECTIVE:   SUBJECTIVE STATEMENT: "I felt like someone was beating my arm this morning " Pt accompanied by: self  PERTINENT HISTORY: No past medical history on file. Pt reporting pain for the past 4 years with intermittent and worsening pain in his right shoulder.  PRECAUTIONS: None  WEIGHT BEARING RESTRICTIONS: No  PAIN:  Are you having pain? Yes: NPRS scale: 10/10 Pain location: anterior shoulder girdle Pain description: sharp and aching Aggravating factors: movement and constant Relieving factors: nothing  FALLS: Has patient fallen in last 6 months? No  LIVING ENVIRONMENT: Lives with: lives alone Lives in: Mobile home  PLOF: Independent  PATIENT GOALS: To get out of pain  NEXT MD VISIT: unsure  OBJECTIVE:   HAND DOMINANCE: Right  ADLs: Overall ADLs:  Severe pain with dressing and bathing, complete's pericare with LUE. typically tries not to use RUE for cooking and cleaning, unable to lift objects due to pain.   FUNCTIONAL OUTCOME MEASURES: Upper Extremity Functional Scale (UEFS): 71.3%  UPPER EXTREMITY ROM:     Active ROM Right eval  Shoulder flexion 62  Shoulder abduction 59  Shoulder internal rotation 90  Shoulder external rotation 15  (Blank rows = not tested)   UPPER EXTREMITY MMT:     MMT Right eval  Shoulder flexion 4/5  Shoulder abduction  3+/5  Shoulder adduction 3/5  Shoulder extension 4-/5  Shoulder internal rotation 3/5  Shoulder external rotation 3+/5  (Blank rows = not tested)  HAND FUNCTION: Grip strength: Right: 54 lbs; Left: 90 lbs  SENSATION: Pt reports tingly sensation along entire RUE  OBSERVATIONS: moderate fascial restrictions along the biceps, trapezius, and anterior shoulder girdle.   TODAY'S TREATMENT:                                                                                                                              DATE:   04/16/23 -Manual Therapy: myofascial release and trigger point applied to biceps, scapular region, trapzius, and deltoid, in order to address fascial restrictions and pain to improve ROM.  -P/ROM: flexion, abduction, horizontal abduction, er/IR, x12 -AA/ROM: flexion, abduction, protraction, horizontal abduciton, er/IR, x10 -Ball Rolls: flexion, abduction, x10 - Isometrics: standing at wall flexion/internal/external rotation 2 sets 30 seconds each    04/15/23 -Manual Therapy: myofascial release and trigger point applied to biceps, scapular region, trapzius, and deltoid, in order to address fascial restrictions and pain to improve ROM.  -P/ROM: flexion, abduction, horizontal abduction, er/IR, x12 -AA/ROM: flexion, abduction, protraction, horizontal abduciton, er/IR, x10 -Ball Rolls: flexion, abduction, x10 -Pulleys: flexion, abduction, x60"   PATIENT EDUCATION: Education details: AA/ROM Person educated: Patient Education method: Medical illustrator Education comprehension: verbalized understanding  HOME EXERCISE PROGRAM: 7/11: AA/ROM  GOALS: Goals reviewed with patient? Yes  SHORT TERM GOALS: Target date: 05/14/23  Pt will be provided and educated on HEP to improve RUE mobility for ADL completion.   Goal status: IN PROGRESS  2.  Pt will decrease pain to 2/10 in RUE in order to sleep 2+ consecutive hours without waking due to pain.   Goal status: IN  PROGRESS  3.  Pt will decrease RUE fascial restrictions to minimal amounts in order to complete reaching tasks with least restrictions/pulling sensation  Goal status: IN PROGRESS  4.  Pt will increase ROM of RUE to [facility] limits in order to reach overhead and behind back during dressing and bathing tasks.   Goal status: IN PROGRESS  5.  Pt will increase strength of RUE to 5/5 in order to complete lifting tasks required during yard work, cooking, and cleaning tasks.   Goal status: IN PROGRESS  ASSESSMENT:  CLINICAL IMPRESSION: Pt reports increased and extreme pain in shoulder/arm/back. He reports that most of the pain in  within his bicep. Noted increased fascial restrictions within axially, bicep, and trapezius regions. Continued with P/ROM and AA/ROM. Pt able to achieve slightly over 50% in shoulder flexion and 35% shoulder abduction. Rest breaks given throughout. Discussed continuing AA/ROM at home.   PERFORMANCE DEFICITS: in functional skills including ADLs, IADLs, ROM, strength, pain, fascial restrictions, muscle spasms, Gross motor control, body mechanics, and UE functional use.   PLAN:  OT FREQUENCY: 2x/week  OT DURATION: 4 weeks  PLANNED INTERVENTIONS: self care/ADL training, therapeutic exercise, therapeutic activity, manual therapy, passive range of motion, functional mobility training, electrical stimulation, moist heat, patient/family education, coping strategies training, and DME and/or AE instructions  RECOMMENDED OTHER SERVICES: N/a  CONSULTED AND AGREED WITH PLAN OF CARE: Patient  PLAN FOR NEXT SESSION: Manual Therapy, P/ROM, Isometrics, AA/ROM, wall slides, ball rolls   [facility] [phone] Bevelyn Ngo, OT 04/16/2023, 2:25 PM

## 2023-04-19 ENCOUNTER — Encounter (HOSPITAL_COMMUNITY): Payer: Self-pay | Admitting: Occupational Therapy

## 2023-04-19 ENCOUNTER — Ambulatory Visit (HOSPITAL_COMMUNITY): Payer: Medicaid Other | Admitting: Occupational Therapy

## 2023-04-19 DIAGNOSIS — R29898 Other symptoms and signs involving the musculoskeletal system: Secondary | ICD-10-CM

## 2023-04-19 DIAGNOSIS — M25611 Stiffness of right shoulder, not elsewhere classified: Secondary | ICD-10-CM

## 2023-04-19 DIAGNOSIS — M25511 Pain in right shoulder: Secondary | ICD-10-CM | POA: Diagnosis not present

## 2023-04-19 NOTE — Patient Instructions (Signed)

## 2023-04-19 NOTE — Therapy (Unsigned)
OUTPATIENT OCCUPATIONAL THERAPY ORTHO TREATMENT NOTE  Patient Name: Joel Castillo MRN: [id number] DOB:12/27/1960, 62 y.o., male Today's Date: 04/20/2023  PCP: Seward Carol, NP REFERRING PROVIDER: Ramond Marrow, MD  END OF SESSION:  OT End of Session - 04/19/23 1600     Visit Number 4    Number of Visits 9    Date for OT Re-Evaluation 05/14/23    Authorization Type [employer] Medicaid    Authorization Time Period 27 visits    Authorization - Visit Number 3    Authorization - Number of Visits 27    Progress Note Due on Visit 10    OT Start Time 1511    OT Stop Time 1551    OT Time Calculation (min) 40 min    Activity Tolerance Patient tolerated treatment well    Behavior During Therapy [facility] for tasks assessed/performed             Past Medical History:  Diagnosis Date   Chronic abdominal pain    Chronic back pain    Chronic bronchitis    Chronic nausea    Complication of anesthesia 11/22/1998   sat O2 dropped during surgery   COPD (chronic obstructive pulmonary disease) (HCC)    Depression    GERD (gastroesophageal reflux disease)    H. pylori infection 03/05/2012   treated with prevpac   Hyperplastic colon polyp    Hypertension    Lumbago    Myocardial infarction (HCC)    2018   Rectal bleeding    Shortness of breath    Sigmoid diverticulosis    Tubulovillous adenoma    Past Surgical History:  Procedure Laterality Date   ANTERIOR CERVICAL DECOMP/DISCECTOMY FUSION N/A 12/17/2022   Procedure: ANTERIOR CERVICAL DISCECTOMY AND FUSION CERVICAL FIVE-SIX/CERVICAL SIX-SEVEN;  Surgeon: Lisbeth Renshaw, MD;  Location: [facility] OR;  Service: Neurosurgery;  Laterality: N/A;   BACK SURGERY  11/22/1998   COLONOSCOPY  03/24/12   Dr. Jena Gauss- multiple rectal and colonic polyps= tubulovillous adenoma, tular adenoma and hyperplastic polyps ,  sigmoid diverticulosis, poor prep   ESOPHAGOGASTRODUODENOSCOPY  03/24/12   Dr. Jena Gauss- patulous EG junction, moderate hiatal hernia, abnormal gastric  mucosa, duodenal erosions, +hpylori   FINGER SURGERY     Patient Active Problem List   Diagnosis Date Noted   Cervical spondylosis with radiculopathy 12/17/2022   Rectal bleeding 02/25/2012   FH: colon cancer 02/25/2012   Epigastric pain 02/25/2012   GERD (gastroesophageal reflux disease) 02/25/2012   Chest pain 02/25/2012    ONSET DATE: ~4 years  REFERRING DIAG: Right Shoulder Tear  THERAPY DIAG:  Right shoulder pain, unspecified chronicity  Shoulder stiffness, right  Other symptoms and signs involving the musculoskeletal system  Rationale for Evaluation and Treatment: Rehabilitation  SUBJECTIVE:   SUBJECTIVE STATEMENT: "I'm always in quite a bit of pain." Pt accompanied by: self  PERTINENT HISTORY: No past medical history on file. Pt reporting pain for the past 4 years with intermittent and worsening pain in his right shoulder.  PRECAUTIONS: None  WEIGHT BEARING RESTRICTIONS: No  PAIN:  Are you having pain? Yes: NPRS scale: 10/10 Pain location: anterior shoulder girdle Pain description: sharp and aching Aggravating factors: movement and constant Relieving factors: nothing  FALLS: Has patient fallen in last 6 months? No  LIVING ENVIRONMENT: Lives with: lives alone Lives in: Mobile home  PLOF: Independent  PATIENT GOALS: To get out of pain  NEXT MD VISIT: unsure  OBJECTIVE:   HAND DOMINANCE: Right  ADLs: Overall ADLs: Severe pain with  dressing and bathing, complete's pericare with LUE. typically tries not to use RUE for cooking and cleaning, unable to lift objects due to pain.   FUNCTIONAL OUTCOME MEASURES: Upper Extremity Functional Scale (UEFS): 71.3%  UPPER EXTREMITY ROM:     Active ROM Right eval  Shoulder flexion 62  Shoulder abduction 59  Shoulder internal rotation 90  Shoulder external rotation 15  (Blank rows = not tested)   UPPER EXTREMITY MMT:     MMT Right eval  Shoulder flexion 4/5  Shoulder abduction 3+/5  Shoulder  adduction 3/5  Shoulder extension 4-/5  Shoulder internal rotation 3/5  Shoulder external rotation 3+/5  (Blank rows = not tested)  HAND FUNCTION: Grip strength: Right: 54 lbs; Left: 90 lbs  SENSATION: Pt reports tingly sensation along entire RUE  OBSERVATIONS: moderate fascial restrictions along the biceps, trapezius, and anterior shoulder girdle.   TODAY'S TREATMENT:                                                                                                                              DATE:   04/19/23 -Manual Therapy: myofascial release and trigger point applied to biceps, scapular region, trapzius, and deltoid, in order to address fascial restrictions and pain to improve ROM.  -AA/ROM: flexion, abduction, protraction, horizontal abduciton, er/IR, x12 -A/ROM: supine, flexion, abduction, protraction, horizontal abduction, er/IR, x5 -Wall Slides: flexion, abduction, x10 -Scapular Strengthening: red band, extension, retraction, protraction, rows, x10  04/16/23 -Manual Therapy: myofascial release and trigger point applied to biceps, scapular region, trapzius, and deltoid, in order to address fascial restrictions and pain to improve ROM.  -P/ROM: flexion, abduction, horizontal abduction, er/IR, x12 -AA/ROM: flexion, abduction, protraction, horizontal abduciton, er/IR, x10 -Ball Rolls: flexion, abduction, x10 -Isometrics: standing at wall flexion/internal/external rotation 2 sets 30 seconds each   04/15/23 -Manual Therapy: myofascial release and trigger point applied to biceps, scapular region, trapzius, and deltoid, in order to address fascial restrictions and pain to improve ROM.  -P/ROM: flexion, abduction, horizontal abduction, er/IR, x12 -AA/ROM: flexion, abduction, protraction, horizontal abduciton, er/IR, x10 -Ball Rolls: flexion, abduction, x10 -Pulleys: flexion, abduction, x60"   PATIENT EDUCATION: Education details: A/ROM Person educated: Patient Education method:  Medical illustrator Education comprehension: verbalized understanding  HOME EXERCISE PROGRAM: 7/11: AA/ROM 7/15: A/ROM  GOALS: Goals reviewed with patient? Yes  SHORT TERM GOALS: Target date: 05/14/23  Pt will be provided and educated on HEP to improve RUE mobility for ADL completion.   Goal status: IN PROGRESS  2.  Pt will decrease pain to 2/10 in RUE in order to sleep 2+ consecutive hours without waking due to pain.   Goal status: IN PROGRESS  3.  Pt will decrease RUE fascial restrictions to minimal amounts in order to complete reaching tasks with least restrictions/pulling sensation  Goal status: IN PROGRESS  4.  Pt will increase ROM of RUE to [facility] limits in order to reach overhead and behind back during dressing and bathing tasks.  Goal status: IN PROGRESS  5.  Pt will increase strength of RUE to 5/5 in order to complete lifting tasks required during yard work, cooking, and cleaning tasks.   Goal status: IN PROGRESS  ASSESSMENT:  CLINICAL IMPRESSION: This session, pt was able to improve his overall ROM with minimal pain in the end ranges. He tolerated increasing to low level scapular resistance exercises for strengthening and stability, with breaks periodically during each task. OT provided manual therapy to decrease moderate to severe fascial restrictions found along the biceps, pectoralis, and scapular region this session. Verbal and visual cuing provided throughout for positioning and technique.   PERFORMANCE DEFICITS: in functional skills including ADLs, IADLs, ROM, strength, pain, fascial restrictions, muscle spasms, Gross motor control, body mechanics, and UE functional use.   PLAN:  OT FREQUENCY: 2x/week  OT DURATION: 4 weeks  PLANNED INTERVENTIONS: self care/ADL training, therapeutic exercise, therapeutic activity, manual therapy, passive range of motion, functional mobility training, electrical stimulation, moist heat, patient/family education,  coping strategies training, and DME and/or AE instructions  RECOMMENDED OTHER SERVICES: N/a  CONSULTED AND AGREED WITH PLAN OF CARE: Patient  PLAN FOR NEXT SESSION: Manual Therapy, P/ROM, Isometrics, AA/ROM, wall slides, ball rolls, A/ROM, Scapular Strengthening   [facility] [phone] Lillien Petronio Rosemarie Beath, OT 04/20/2023, 12:58 PM

## 2023-04-21 ENCOUNTER — Ambulatory Visit (HOSPITAL_COMMUNITY): Payer: Medicaid Other | Admitting: Occupational Therapy

## 2023-04-21 ENCOUNTER — Encounter (HOSPITAL_COMMUNITY): Payer: Self-pay | Admitting: Occupational Therapy

## 2023-04-21 DIAGNOSIS — M25611 Stiffness of right shoulder, not elsewhere classified: Secondary | ICD-10-CM

## 2023-04-21 DIAGNOSIS — M25511 Pain in right shoulder: Secondary | ICD-10-CM | POA: Diagnosis not present

## 2023-04-21 DIAGNOSIS — R29898 Other symptoms and signs involving the musculoskeletal system: Secondary | ICD-10-CM

## 2023-04-21 NOTE — Patient Instructions (Signed)

## 2023-04-21 NOTE — Therapy (Signed)
OUTPATIENT OCCUPATIONAL THERAPY ORTHO TREATMENT NOTE  Patient Name: Joel Castillo MRN: [id number] DOB:09/28/1961, 62 y.o., male Today's Date: 04/22/2023  PCP: Seward Carol, NP REFERRING PROVIDER: Ramond Marrow, MD  END OF SESSION:  OT End of Session - 04/22/23 1541     Visit Number 5    Number of Visits 9    Date for OT Re-Evaluation 05/14/23    Authorization Type [employer] Medicaid    Authorization Time Period 27 visits    Authorization - Visit Number 4    Authorization - Number of Visits 27    Progress Note Due on Visit 10    OT Start Time 1348    OT Stop Time 1431    OT Time Calculation (min) 43 min    Activity Tolerance Patient tolerated treatment well    Behavior During Therapy WFL for tasks assessed/performed             Past Medical History:  Diagnosis Date   Chronic abdominal pain    Chronic back pain    Chronic bronchitis    Chronic nausea    Complication of anesthesia 11/22/1998   sat O2 dropped during surgery   COPD (chronic obstructive pulmonary disease) (HCC)    Depression    GERD (gastroesophageal reflux disease)    H. pylori infection 03/05/2012   treated with prevpac   Hyperplastic colon polyp    Hypertension    Lumbago    Myocardial infarction (HCC)    2018   Rectal bleeding    Shortness of breath    Sigmoid diverticulosis    Tubulovillous adenoma    Past Surgical History:  Procedure Laterality Date   ANTERIOR CERVICAL DECOMP/DISCECTOMY FUSION N/A 12/17/2022   Procedure: ANTERIOR CERVICAL DISCECTOMY AND FUSION CERVICAL FIVE-SIX/CERVICAL SIX-SEVEN;  Surgeon: Lisbeth Renshaw, MD;  Location: [facility] OR;  Service: Neurosurgery;  Laterality: N/A;   BACK SURGERY  11/22/1998   COLONOSCOPY  03/24/12   Dr. Jena Gauss- multiple rectal and colonic polyps= tubulovillous adenoma, tular adenoma and hyperplastic polyps ,  sigmoid diverticulosis, poor prep   ESOPHAGOGASTRODUODENOSCOPY  03/24/12   Dr. Jena Gauss- patulous EG junction, moderate hiatal hernia, abnormal gastric  mucosa, duodenal erosions, +hpylori   FINGER SURGERY     Patient Active Problem List   Diagnosis Date Noted   Cervical spondylosis with radiculopathy 12/17/2022   Rectal bleeding 02/25/2012   FH: colon cancer 02/25/2012   Epigastric pain 02/25/2012   GERD (gastroesophageal reflux disease) 02/25/2012   Chest pain 02/25/2012    ONSET DATE: ~4 years  REFERRING DIAG: Right Shoulder Tear  THERAPY DIAG:  Right shoulder pain, unspecified chronicity  Shoulder stiffness, right  Other symptoms and signs involving the musculoskeletal system  Rationale for Evaluation and Treatment: Rehabilitation  SUBJECTIVE:   SUBJECTIVE STATEMENT: "It's not too bad I don't think" Pt accompanied by: self  PERTINENT HISTORY: No past medical history on file. Pt reporting pain for the past 4 years with intermittent and worsening pain in his right shoulder.  PRECAUTIONS: None  WEIGHT BEARING RESTRICTIONS: No  PAIN:  Are you having pain? Yes: NPRS scale: 6/10 Pain location: anterior shoulder girdle Pain description: sharp and aching Aggravating factors: movement and constant Relieving factors: nothing  FALLS: Has patient fallen in last 6 months? No  LIVING ENVIRONMENT: Lives with: lives alone Lives in: Mobile home  PLOF: Independent  PATIENT GOALS: To get out of pain  NEXT MD VISIT: unsure  OBJECTIVE:   HAND DOMINANCE: Right  ADLs: Overall ADLs: Severe pain with dressing  and bathing, complete's pericare with LUE. typically tries not to use RUE for cooking and cleaning, unable to lift objects due to pain.   FUNCTIONAL OUTCOME MEASURES: Upper Extremity Functional Scale (UEFS): 71.3%  UPPER EXTREMITY ROM:     Active ROM Right eval  Shoulder flexion 62  Shoulder abduction 59  Shoulder internal rotation 90  Shoulder external rotation 15  (Blank rows = not tested)   UPPER EXTREMITY MMT:     MMT Right eval  Shoulder flexion 4/5  Shoulder abduction 3+/5  Shoulder adduction  3/5  Shoulder extension 4-/5  Shoulder internal rotation 3/5  Shoulder external rotation 3+/5  (Blank rows = not tested)  HAND FUNCTION: Grip strength: Right: 54 lbs; Left: 90 lbs  SENSATION: Pt reports tingly sensation along entire RUE  OBSERVATIONS: moderate fascial restrictions along the biceps, trapezius, and anterior shoulder girdle.   TODAY'S TREATMENT:                                                                                                                              DATE:   04/21/23 -Manual Therapy: myofascial release and trigger point applied to biceps, scapular region, trapzius, and deltoid, in order to address fascial restrictions and pain to improve ROM.  -AA/ROM: 2lb dowel, flexion, abduction, protraction, horizontal abduciton, er/IR, x15 -A/ROM: supine, flexion, abduction, protraction, horizontal abduction, er/IR, x8 -Proximal Shoulder Exercises:  paddles, criss cross, circles both directions, x10 -ABC's in the Air with red weighted ball -Scapular Strengthening: green band, extension, retraction, protraction, rows, x12 -UBE: Level 1, KPH 3.0+, 2.5' forwards and backwards  04/19/23 -Manual Therapy: myofascial release and trigger point applied to biceps, scapular region, trapzius, and deltoid, in order to address fascial restrictions and pain to improve ROM.  -AA/ROM: flexion, abduction, protraction, horizontal abduciton, er/IR, x12 -A/ROM: supine, flexion, abduction, protraction, horizontal abduction, er/IR, x5 -Wall Slides: flexion, abduction, x10 -Scapular Strengthening: red band, extension, retraction, protraction, rows, x10  04/16/23 -Manual Therapy: myofascial release and trigger point applied to biceps, scapular region, trapzius, and deltoid, in order to address fascial restrictions and pain to improve ROM.  -P/ROM: flexion, abduction, horizontal abduction, er/IR, x12 -AA/ROM: flexion, abduction, protraction, horizontal abduciton, er/IR, x10 -Ball Rolls:  flexion, abduction, x10 -Isometrics: standing at wall flexion/internal/external rotation 2 sets 30 seconds each    PATIENT EDUCATION: Education details: Publishing rights manager Person educated: Patient Education method: Explanation and Demonstration Education comprehension: verbalized understanding  HOME EXERCISE PROGRAM: 7/11: AA/ROM 7/15: A/ROM 7/18: Scapular Strengthening  GOALS: Goals reviewed with patient? Yes  SHORT TERM GOALS: Target date: 05/14/23  Pt will be provided and educated on HEP to improve RUE mobility for ADL completion.   Goal status: IN PROGRESS  2.  Pt will decrease pain to 2/10 in RUE in order to sleep 2+ consecutive hours without waking due to pain.   Goal status: IN PROGRESS  3.  Pt will decrease RUE fascial restrictions to minimal amounts in order to complete reaching tasks  with least restrictions/pulling sensation  Goal status: IN PROGRESS  4.  Pt will increase ROM of RUE to [facility] limits in order to reach overhead and behind back during dressing and bathing tasks.   Goal status: IN PROGRESS  5.  Pt will increase strength of RUE to 5/5 in order to complete lifting tasks required during yard work, cooking, and cleaning tasks.   Goal status: IN PROGRESS  ASSESSMENT:  CLINICAL IMPRESSION: Pt continuing to improve his overall ROM. This session he was able to achieve 80% of full ROM, both actively and assisted. He was tolerating endurance based exercises and requiring only 1 rest break throughout these tasks. OT initiated scapular strengthening this session, which he was able to tolerate well with a moderate resistance bands. Verbal and visual cuing provided for positioning and technique throughout session.   PERFORMANCE DEFICITS: in functional skills including ADLs, IADLs, ROM, strength, pain, fascial restrictions, muscle spasms, Gross motor control, body mechanics, and UE functional use.   PLAN:  OT FREQUENCY: 2x/week  OT DURATION: 4  weeks  PLANNED INTERVENTIONS: self care/ADL training, therapeutic exercise, therapeutic activity, manual therapy, passive range of motion, functional mobility training, electrical stimulation, moist heat, patient/family education, coping strategies training, and DME and/or AE instructions  RECOMMENDED OTHER SERVICES: N/a  CONSULTED AND AGREED WITH PLAN OF CARE: Patient  PLAN FOR NEXT SESSION: Manual Therapy, P/ROM, Isometrics, AA/ROM, wall slides, ball rolls, A/ROM, Scapular Strengthening   [facility] Out[facility] [phone] Jamicheal Heard Rosemarie Beath, OT 04/22/2023, 3:42 PM

## 2023-04-26 ENCOUNTER — Encounter (HOSPITAL_COMMUNITY): Payer: Self-pay | Admitting: Occupational Therapy

## 2023-04-26 ENCOUNTER — Ambulatory Visit (HOSPITAL_COMMUNITY): Payer: Medicaid Other | Admitting: Occupational Therapy

## 2023-04-26 DIAGNOSIS — M25511 Pain in right shoulder: Secondary | ICD-10-CM | POA: Diagnosis not present

## 2023-04-26 DIAGNOSIS — M25611 Stiffness of right shoulder, not elsewhere classified: Secondary | ICD-10-CM

## 2023-04-26 DIAGNOSIS — R29898 Other symptoms and signs involving the musculoskeletal system: Secondary | ICD-10-CM

## 2023-04-26 NOTE — Therapy (Signed)
OUTPATIENT OCCUPATIONAL THERAPY ORTHO TREATMENT NOTE  Patient Name: Joel Castillo MRN: [id number] DOB:03-21-1961, 62 y.o., male Today's Date: 04/26/2023  PCP: Seward Carol, NP REFERRING PROVIDER: Ramond Marrow, MD  END OF SESSION:  OT End of Session - 04/26/23 1739     Visit Number 6    Number of Visits 9    Date for OT Re-Evaluation 05/14/23    Authorization Type [employer]    Authorization Time Period 27 visits    Authorization - Visit Number 5    Authorization - Number of Visits 27    Progress Note Due on Visit 10    OT Start Time 1303    OT Stop Time 1344    OT Time Calculation (min) 41 min    Activity Tolerance Patient tolerated treatment well    Behavior During Therapy [facility] for tasks assessed/performed             Past Medical History:  Diagnosis Date   Chronic abdominal pain    Chronic back pain    Chronic bronchitis    Chronic nausea    Complication of anesthesia 11/22/1998   sat O2 dropped during surgery   COPD (chronic obstructive pulmonary disease) (HCC)    Depression    GERD (gastroesophageal reflux disease)    H. pylori infection 03/05/2012   treated with prevpac   Hyperplastic colon polyp    Hypertension    Lumbago    Myocardial infarction (HCC)    2018   Rectal bleeding    Shortness of breath    Sigmoid diverticulosis    Tubulovillous adenoma    Past Surgical History:  Procedure Laterality Date   ANTERIOR CERVICAL DECOMP/DISCECTOMY FUSION N/A 12/17/2022   Procedure: ANTERIOR CERVICAL DISCECTOMY AND FUSION CERVICAL FIVE-SIX/CERVICAL SIX-SEVEN;  Surgeon: Lisbeth Renshaw, MD;  Location: [facility] OR;  Service: Neurosurgery;  Laterality: N/A;   BACK SURGERY  11/22/1998   COLONOSCOPY  03/24/12   Dr. Jena Gauss- multiple rectal and colonic polyps= tubulovillous adenoma, tular adenoma and hyperplastic polyps ,  sigmoid diverticulosis, poor prep   ESOPHAGOGASTRODUODENOSCOPY  03/24/12   Dr. Jena Gauss- patulous EG junction, moderate hiatal hernia, abnormal gastric  mucosa, duodenal erosions, +hpylori   FINGER SURGERY     Patient Active Problem List   Diagnosis Date Noted   Cervical spondylosis with radiculopathy 12/17/2022   Rectal bleeding 02/25/2012   FH: colon cancer 02/25/2012   Epigastric pain 02/25/2012   GERD (gastroesophageal reflux disease) 02/25/2012   Chest pain 02/25/2012    ONSET DATE: ~4 years  REFERRING DIAG: Right Shoulder Tear  THERAPY DIAG:  Right shoulder pain, unspecified chronicity  Shoulder stiffness, right  Other symptoms and signs involving the musculoskeletal system  Rationale for Evaluation and Treatment: Rehabilitation  SUBJECTIVE:   SUBJECTIVE STATEMENT: "Yesterday the pain was so bad I told my nephew to cut it off." Pt accompanied by: self  PERTINENT HISTORY: No past medical history on file. Pt reporting pain for the past 4 years with intermittent and worsening pain in his right shoulder.  PRECAUTIONS: None  WEIGHT BEARING RESTRICTIONS: No  PAIN:  Are you having pain? Yes: NPRS scale: 6/10 Pain location: anterior shoulder girdle Pain description: sharp and aching Aggravating factors: movement and constant Relieving factors: nothing  FALLS: Has patient fallen in last 6 months? No  LIVING ENVIRONMENT: Lives with: lives alone Lives in: Mobile home  PLOF: Independent  PATIENT GOALS: To get out of pain  NEXT MD VISIT: unsure  OBJECTIVE:   HAND DOMINANCE: Right  ADLs: Overall ADLs: Severe pain with dressing and bathing, complete's pericare with LUE. typically tries not to use RUE for cooking and cleaning, unable to lift objects due to pain.   FUNCTIONAL OUTCOME MEASURES: Upper Extremity Functional Scale (UEFS): 71.3%  UPPER EXTREMITY ROM:     Active ROM Right eval  Shoulder flexion 62  Shoulder abduction 59  Shoulder internal rotation 90  Shoulder external rotation 15  (Blank rows = not tested)   UPPER EXTREMITY MMT:     MMT Right eval  Shoulder flexion 4/5  Shoulder  abduction 3+/5  Shoulder adduction 3/5  Shoulder extension 4-/5  Shoulder internal rotation 3/5  Shoulder external rotation 3+/5  (Blank rows = not tested)  HAND FUNCTION: Grip strength: Right: 54 lbs; Left: 90 lbs  SENSATION: Pt reports tingly sensation along entire RUE  OBSERVATIONS: moderate fascial restrictions along the biceps, trapezius, and anterior shoulder girdle.   TODAY'S TREATMENT:                                                                                                                              DATE:   04/26/23 -Manual Therapy: myofascial release and trigger point applied to biceps, scapular region, trapzius, and deltoid, in order to address fascial restrictions and pain to improve ROM.  -A/ROM: supine, flexion, abduction, protraction, horizontal abduction, er/IR, x12 -Overhead Lacing -Ball on the Wall: flexion and Abduction, x10 -Therapy Ball Exercises: flexion, protraction, overhead press, V up, circles both directions, x10 -Shoulder Strengthening: 2lb dumbbell, protraction, horizontal abduction, er/IR, flexion, abduction, x10  04/21/23 -Manual Therapy: myofascial release and trigger point applied to biceps, scapular region, trapzius, and deltoid, in order to address fascial restrictions and pain to improve ROM.  -AA/ROM: 2lb dowel, flexion, abduction, protraction, horizontal abduciton, er/IR, x15 -A/ROM: supine, flexion, abduction, protraction, horizontal abduction, er/IR, x8 -Proximal Shoulder Exercises:  paddles, criss cross, circles both directions, x10 -ABC's in the Air with red weighted ball -Scapular Strengthening: green band, extension, retraction, protraction, rows, x12 -UBE: Level 1, KPH 3.0+, 2.5' forwards and backwards  04/19/23 -Manual Therapy: myofascial release and trigger point applied to biceps, scapular region, trapzius, and deltoid, in order to address fascial restrictions and pain to improve ROM.  -AA/ROM: flexion, abduction, protraction,  horizontal abduciton, er/IR, x12 -A/ROM: supine, flexion, abduction, protraction, horizontal abduction, er/IR, x5 -Wall Slides: flexion, abduction, x10 -Scapular Strengthening: red band, extension, retraction, protraction, rows, x10   PATIENT EDUCATION: Education details: Continue HEP Person educated: Patient Education method: Explanation and Demonstration Education comprehension: verbalized understanding  HOME EXERCISE PROGRAM: 7/11: AA/ROM 7/15: A/ROM 7/18: Scapular Strengthening  GOALS: Goals reviewed with patient? Yes  SHORT TERM GOALS: Target date: 05/14/23  Pt will be provided and educated on HEP to improve RUE mobility for ADL completion.   Goal status: IN PROGRESS  2.  Pt will decrease pain to 2/10 in RUE in order to sleep 2+ consecutive hours without waking due to pain.   Goal status: IN PROGRESS  3.  Pt will decrease RUE fascial restrictions to minimal amounts in order to complete reaching tasks with least restrictions/pulling sensation  Goal status: IN PROGRESS  4.  Pt will increase ROM of RUE to [facility] limits in order to reach overhead and behind back during dressing and bathing tasks.   Goal status: IN PROGRESS  5.  Pt will increase strength of RUE to 5/5 in order to complete lifting tasks required during yard work, cooking, and cleaning tasks.   Goal status: IN PROGRESS  ASSESSMENT:  CLINICAL IMPRESSION: This session pt continuing to reach approximately 80% of his full ROM. He is having pain with end ranges, but able to push through them. OT adding therapy ball exercises and proximal shoulder exercises to improve mobility and endurance. Verbal and tactile cuing provided for positioning and technique throughout session.   PERFORMANCE DEFICITS: in functional skills including ADLs, IADLs, ROM, strength, pain, fascial restrictions, muscle spasms, Gross motor control, body mechanics, and UE functional use.   PLAN:  OT FREQUENCY: 2x/week  OT DURATION: 4  weeks  PLANNED INTERVENTIONS: self care/ADL training, therapeutic exercise, therapeutic activity, manual therapy, passive range of motion, functional mobility training, electrical stimulation, moist heat, patient/family education, coping strategies training, and DME and/or AE instructions  RECOMMENDED OTHER SERVICES: N/a  CONSULTED AND AGREED WITH PLAN OF CARE: Patient  PLAN FOR NEXT SESSION: Manual Therapy, P/ROM, Isometrics, AA/ROM, wall slides, ball rolls, A/ROM, Scapular Strengthening   Elzina Devera Bing Plume, OTR/L [facility] [phone] Kennyth Arnold, OT 04/26/2023, 5:41 PM

## 2023-04-27 ENCOUNTER — Other Ambulatory Visit: Payer: Self-pay | Admitting: Orthopaedic Surgery

## 2023-04-27 DIAGNOSIS — Z01818 Encounter for other preprocedural examination: Secondary | ICD-10-CM

## 2023-04-29 ENCOUNTER — Encounter (HOSPITAL_COMMUNITY): Payer: Medicaid Other | Admitting: Occupational Therapy

## 2023-05-03 ENCOUNTER — Ambulatory Visit (HOSPITAL_COMMUNITY): Payer: Medicaid Other | Admitting: Occupational Therapy

## 2023-05-03 ENCOUNTER — Encounter (HOSPITAL_COMMUNITY): Payer: Self-pay | Admitting: Occupational Therapy

## 2023-05-03 DIAGNOSIS — M25611 Stiffness of right shoulder, not elsewhere classified: Secondary | ICD-10-CM

## 2023-05-03 DIAGNOSIS — M25511 Pain in right shoulder: Secondary | ICD-10-CM

## 2023-05-03 DIAGNOSIS — R29898 Other symptoms and signs involving the musculoskeletal system: Secondary | ICD-10-CM

## 2023-05-03 NOTE — Patient Instructions (Signed)

## 2023-05-03 NOTE — Therapy (Signed)
OUTPATIENT OCCUPATIONAL THERAPY ORTHO TREATMENT NOTE  Patient Name: Joel Castillo MRN: [id number] DOB:10/25/1960, 62 y.o., male Today's Date: 05/03/2023  PCP: Seward Carol, NP REFERRING PROVIDER: Ramond Marrow, MD  END OF SESSION:  OT End of Session - 05/03/23 1345     Visit Number 7    Number of Visits 9    Date for OT Re-Evaluation 05/14/23    Authorization Type [employer] Medicaid    Authorization Time Period 27 visits    Authorization - Visit Number 6    Authorization - Number of Visits 27    Progress Note Due on Visit 10    OT Start Time 1304    OT Stop Time 1345    OT Time Calculation (min) 41 min    Activity Tolerance Patient tolerated treatment well    Behavior During Therapy [facility] for tasks assessed/performed              Past Medical History:  Diagnosis Date   Chronic abdominal pain    Chronic back pain    Chronic bronchitis    Chronic nausea    Complication of anesthesia 11/22/1998   sat O2 dropped during surgery   COPD (chronic obstructive pulmonary disease) (HCC)    Depression    GERD (gastroesophageal reflux disease)    H. pylori infection 03/05/2012   treated with prevpac   Hyperplastic colon polyp    Hypertension    Lumbago    Myocardial infarction (HCC)    2018   Rectal bleeding    Shortness of breath    Sigmoid diverticulosis    Tubulovillous adenoma    Past Surgical History:  Procedure Laterality Date   ANTERIOR CERVICAL DECOMP/DISCECTOMY FUSION N/A 12/17/2022   Procedure: ANTERIOR CERVICAL DISCECTOMY AND FUSION CERVICAL FIVE-SIX/CERVICAL SIX-SEVEN;  Surgeon: Lisbeth Renshaw, MD;  Location: [facility] OR;  Service: Neurosurgery;  Laterality: N/A;   BACK SURGERY  11/22/1998   COLONOSCOPY  03/24/12   Dr. Jena Gauss- multiple rectal and colonic polyps= tubulovillous adenoma, tular adenoma and hyperplastic polyps ,  sigmoid diverticulosis, poor prep   ESOPHAGOGASTRODUODENOSCOPY  03/24/12   Dr. Jena Gauss- patulous EG junction, moderate hiatal hernia, abnormal gastric  mucosa, duodenal erosions, +hpylori   FINGER SURGERY     Patient Active Problem List   Diagnosis Date Noted   Cervical spondylosis with radiculopathy 12/17/2022   Rectal bleeding 02/25/2012   FH: colon cancer 02/25/2012   Epigastric pain 02/25/2012   GERD (gastroesophageal reflux disease) 02/25/2012   Chest pain 02/25/2012    ONSET DATE: ~4 years  REFERRING DIAG: Right Shoulder Tear  THERAPY DIAG:  Right shoulder pain, unspecified chronicity  Shoulder stiffness, right  Other symptoms and signs involving the musculoskeletal system  Rationale for Evaluation and Treatment: Rehabilitation  SUBJECTIVE:   SUBJECTIVE STATEMENT: "This weekend has been rough, It keeps waking me up at night" Pt accompanied by: self  PERTINENT HISTORY: No past medical history on file. Pt reporting pain for the past 4 years with intermittent and worsening pain in his right shoulder.  PRECAUTIONS: None  WEIGHT BEARING RESTRICTIONS: No  PAIN:  Are you having pain? Yes: NPRS scale: 10/10 Pain location: anterior shoulder girdle Pain description: sharp and aching Aggravating factors: movement and constant Relieving factors: nothing  FALLS: Has patient fallen in last 6 months? No  LIVING ENVIRONMENT: Lives with: lives alone Lives in: Mobile home  PLOF: Independent  PATIENT GOALS: To get out of pain  NEXT MD VISIT: unsure  OBJECTIVE:   HAND DOMINANCE: Right  ADLs:  Overall ADLs: Severe pain with dressing and bathing, complete's pericare with LUE. typically tries not to use RUE for cooking and cleaning, unable to lift objects due to pain.   FUNCTIONAL OUTCOME MEASURES: Upper Extremity Functional Scale (UEFS): 71.3%  UPPER EXTREMITY ROM:     Active ROM Right eval  Shoulder flexion 62  Shoulder abduction 59  Shoulder internal rotation 90  Shoulder external rotation 15  (Blank rows = not tested)   UPPER EXTREMITY MMT:     MMT Right eval  Shoulder flexion 4/5  Shoulder  abduction 3+/5  Shoulder adduction 3/5  Shoulder extension 4-/5  Shoulder internal rotation 3/5  Shoulder external rotation 3+/5  (Blank rows = not tested)  HAND FUNCTION: Grip strength: Right: 54 lbs; Left: 90 lbs  SENSATION: Pt reports tingly sensation along entire RUE  OBSERVATIONS: moderate fascial restrictions along the biceps, trapezius, and anterior shoulder girdle.   TODAY'S TREATMENT:                                                                                                                              DATE:   05/03/23 -Manual Therapy: myofascial release and trigger point applied to biceps, scapular region, trapzius, and deltoid, in order to address fascial restrictions and pain to improve ROM.  -A/ROM: seated, flexion, abduction, protraction, horizontal abduction, er/IR, x12 -Wall Slides: flexion, abduction, x10 -Scapular strengthening: green band, extension, retraction, rows, x15 -Shoulder Strengthening: green band, horizontal abduction, er, IR, flexion, abduction, x15 -functional reaching: counter to shoulder height, to head height shelf, x10 cones in flexion, bringing them down in abduction  04/26/23 -Manual Therapy: myofascial release and trigger point applied to biceps, scapular region, trapzius, and deltoid, in order to address fascial restrictions and pain to improve ROM.  -A/ROM: supine, flexion, abduction, protraction, horizontal abduction, er/IR, x12 -Overhead Lacing -Ball on the Wall: flexion and Abduction, x10 -Therapy Ball Exercises: flexion, protraction, overhead press, V up, circles both directions, x10 -Shoulder Strengthening: 2lb dumbbell, protraction, horizontal abduction, er/IR, flexion, abduction, x10  04/21/23 -Manual Therapy: myofascial release and trigger point applied to biceps, scapular region, trapzius, and deltoid, in order to address fascial restrictions and pain to improve ROM.  -AA/ROM: 2lb dowel, flexion, abduction, protraction,  horizontal abduciton, er/IR, x15 -A/ROM: supine, flexion, abduction, protraction, horizontal abduction, er/IR, x8 -Proximal Shoulder Exercises:  paddles, criss cross, circles both directions, x10 -ABC's in the Air with red weighted ball -Scapular Strengthening: green band, extension, retraction, protraction, rows, x12 -UBE: Level 1, KPH 3.0+, 2.5' forwards and backwards   PATIENT EDUCATION: Education details: Public relations account executive w/ band Person educated: Patient Education method: Explanation and Demonstration Education comprehension: verbalized understanding  HOME EXERCISE PROGRAM: 7/11: AA/ROM 7/15: A/ROM 7/18: Scapular Strengthening 7/29: Shoulder Strengthening  GOALS: Goals reviewed with patient? Yes  SHORT TERM GOALS: Target date: 05/14/23  Pt will be provided and educated on HEP to improve RUE mobility for ADL completion.   Goal status: IN PROGRESS  2.  Pt  will decrease pain to 2/10 in RUE in order to sleep 2+ consecutive hours without waking due to pain.   Goal status: IN PROGRESS  3.  Pt will decrease RUE fascial restrictions to minimal amounts in order to complete reaching tasks with least restrictions/pulling sensation  Goal status: IN PROGRESS  4.  Pt will increase ROM of RUE to [facility] limits in order to reach overhead and behind back during dressing and bathing tasks.   Goal status: IN PROGRESS  5.  Pt will increase strength of RUE to 5/5 in order to complete lifting tasks required during yard work, cooking, and cleaning tasks.   Goal status: IN PROGRESS  ASSESSMENT:  CLINICAL IMPRESSION: Pt presenting to therapy with increased pain this session, limiting tolerance, strength, and mobility. He was reaching approximately 60-65% of full ROM, even with wall slides to stretch the arm out. OT adding shoulder strengthening to target all accessory muscles that assist with mobility. Pt continued to have higher level of pain throughout session. Pt reporting grinding in  the shoulder joint with all abduction type exercises. Verbal and tactile cuing provided intermittently throughout session for positioning and technique.   PERFORMANCE DEFICITS: in functional skills including ADLs, IADLs, ROM, strength, pain, fascial restrictions, muscle spasms, Gross motor control, body mechanics, and UE functional use.   PLAN:  OT FREQUENCY: 2x/week  OT DURATION: 4 weeks  PLANNED INTERVENTIONS: self care/ADL training, therapeutic exercise, therapeutic activity, manual therapy, passive range of motion, functional mobility training, electrical stimulation, moist heat, patient/family education, coping strategies training, and DME and/or AE instructions  RECOMMENDED OTHER SERVICES: N/a  CONSULTED AND AGREED WITH PLAN OF CARE: Patient  PLAN FOR NEXT SESSION: Manual Therapy, P/ROM, Isometrics, AA/ROM, wall slides, ball rolls, A/ROM, Scapular Strengthening   Leylah Tarnow Bing Plume, OTR/L [facility] [phone] Karlena Luebke Rosemarie Beath, OT 05/03/2023, 1:46 PM

## 2023-05-05 ENCOUNTER — Encounter (HOSPITAL_COMMUNITY): Payer: Self-pay | Admitting: Occupational Therapy

## 2023-05-05 ENCOUNTER — Ambulatory Visit (HOSPITAL_COMMUNITY): Payer: Medicaid Other | Admitting: Occupational Therapy

## 2023-05-05 DIAGNOSIS — M25611 Stiffness of right shoulder, not elsewhere classified: Secondary | ICD-10-CM

## 2023-05-05 DIAGNOSIS — R29898 Other symptoms and signs involving the musculoskeletal system: Secondary | ICD-10-CM

## 2023-05-05 DIAGNOSIS — M25511 Pain in right shoulder: Secondary | ICD-10-CM | POA: Diagnosis not present

## 2023-05-05 NOTE — Therapy (Signed)
OUTPATIENT OCCUPATIONAL THERAPY ORTHO TREATMENT NOTE  Patient Name: Joel Castillo MRN: [id number] DOB:06-Feb-1961, 62 y.o., male Today's Date: 05/05/2023  PCP: Seward Carol, NP REFERRING PROVIDER: Ramond Marrow, MD  END OF SESSION:  OT End of Session - 05/05/23 1418     Visit Number 8    Number of Visits 9    Date for OT Re-Evaluation 05/14/23    Authorization Type [employer] Medicaid    Authorization Time Period 27 visits    Authorization - Visit Number 7    Authorization - Number of Visits 27    Progress Note Due on Visit 10    OT Start Time 1301    OT Stop Time 1342    OT Time Calculation (min) 41 min    Activity Tolerance Patient tolerated treatment well    Behavior During Therapy [facility] for tasks assessed/performed             Past Medical History:  Diagnosis Date   Chronic abdominal pain    Chronic back pain    Chronic bronchitis    Chronic nausea    Complication of anesthesia 11/22/1998   sat O2 dropped during surgery   COPD (chronic obstructive pulmonary disease) (HCC)    Depression    GERD (gastroesophageal reflux disease)    H. pylori infection 03/05/2012   treated with prevpac   Hyperplastic colon polyp    Hypertension    Lumbago    Myocardial infarction (HCC)    2018   Rectal bleeding    Shortness of breath    Sigmoid diverticulosis    Tubulovillous adenoma    Past Surgical History:  Procedure Laterality Date   ANTERIOR CERVICAL DECOMP/DISCECTOMY FUSION N/A 12/17/2022   Procedure: ANTERIOR CERVICAL DISCECTOMY AND FUSION CERVICAL FIVE-SIX/CERVICAL SIX-SEVEN;  Surgeon: Lisbeth Renshaw, MD;  Location: [facility] OR;  Service: Neurosurgery;  Laterality: N/A;   BACK SURGERY  11/22/1998   COLONOSCOPY  03/24/12   Dr. Jena Gauss- multiple rectal and colonic polyps= tubulovillous adenoma, tular adenoma and hyperplastic polyps ,  sigmoid diverticulosis, poor prep   ESOPHAGOGASTRODUODENOSCOPY  03/24/12   Dr. Jena Gauss- patulous EG junction, moderate hiatal hernia, abnormal gastric  mucosa, duodenal erosions, +hpylori   FINGER SURGERY     Patient Active Problem List   Diagnosis Date Noted   Cervical spondylosis with radiculopathy 12/17/2022   Rectal bleeding 02/25/2012   FH: colon cancer 02/25/2012   Epigastric pain 02/25/2012   GERD (gastroesophageal reflux disease) 02/25/2012   Chest pain 02/25/2012    ONSET DATE: ~4 years  REFERRING DIAG: Right Shoulder Tear  THERAPY DIAG:  Right shoulder pain, unspecified chronicity  Shoulder stiffness, right  Other symptoms and signs involving the musculoskeletal system  Rationale for Evaluation and Treatment: Rehabilitation  SUBJECTIVE:   SUBJECTIVE STATEMENT: "It's just bothering me all the time." Pt accompanied by: self  PERTINENT HISTORY: No past medical history on file. Pt reporting pain for the past 4 years with intermittent and worsening pain in his right shoulder.  PRECAUTIONS: None  WEIGHT BEARING RESTRICTIONS: No  PAIN:  Are you having pain? Yes: NPRS scale: 6/10 Pain location: anterior shoulder girdle Pain description: sharp and aching Aggravating factors: movement and constant Relieving factors: nothing  FALLS: Has patient fallen in last 6 months? No  LIVING ENVIRONMENT: Lives with: lives alone Lives in: Mobile home  PLOF: Independent  PATIENT GOALS: To get out of pain  NEXT MD VISIT: unsure  OBJECTIVE:   HAND DOMINANCE: Right  ADLs: Overall ADLs: Severe pain with dressing  and bathing, complete's pericare with LUE. typically tries not to use RUE for cooking and cleaning, unable to lift objects due to pain.   FUNCTIONAL OUTCOME MEASURES: Upper Extremity Functional Scale (UEFS): 71.3%  UPPER EXTREMITY ROM:     Active ROM Right eval  Shoulder flexion 62  Shoulder abduction 59  Shoulder internal rotation 90  Shoulder external rotation 15  (Blank rows = not tested)   UPPER EXTREMITY MMT:     MMT Right eval  Shoulder flexion 4/5  Shoulder abduction 3+/5  Shoulder  adduction 3/5  Shoulder extension 4-/5  Shoulder internal rotation 3/5  Shoulder external rotation 3+/5  (Blank rows = not tested)  HAND FUNCTION: Grip strength: Right: 54 lbs; Left: 90 lbs  SENSATION: Pt reports tingly sensation along entire RUE  OBSERVATIONS: moderate fascial restrictions along the biceps, trapezius, and anterior shoulder girdle.   TODAY'S TREATMENT:                                                                                                                              DATE:   05/05/23 -Manual Therapy: myofascial release and trigger point applied to biceps, scapular region, trapzius, and deltoid, in order to address fascial restrictions and pain to improve ROM.  -A/ROM: seated, flexion, abduction, protraction, horizontal abduction, er/IR, x15 -X to V arms, x12 -Goal Post arms, x12 -Shoulder strengthening: 2lb dumbbells, flexion, abduction, protraction, horizontal abduction, er/IR,   05/03/23 -Manual Therapy: myofascial release and trigger point applied to biceps, scapular region, trapzius, and deltoid, in order to address fascial restrictions and pain to improve ROM.  -A/ROM: seated, flexion, abduction, protraction, horizontal abduction, er/IR, x12 -Wall Slides: flexion, abduction, x10 -Scapular strengthening: green band, extension, retraction, rows, x15 -Shoulder Strengthening: green band, horizontal abduction, er, IR, flexion, abduction, x15 -functional reaching: counter to shoulder height, to head height shelf, x10 cones in flexion, bringing them down in abduction  04/26/23 -Manual Therapy: myofascial release and trigger point applied to biceps, scapular region, trapzius, and deltoid, in order to address fascial restrictions and pain to improve ROM.  -A/ROM: supine, flexion, abduction, protraction, horizontal abduction, er/IR, x12 -Overhead Lacing -Ball on the Wall: flexion and Abduction, x10 -Therapy Ball Exercises: flexion, protraction, overhead press, V  up, circles both directions, x10 -Shoulder Strengthening: 2lb dumbbell, protraction, horizontal abduction, er/IR, flexion, abduction, x10    PATIENT EDUCATION: Education details: Public relations account executive w/ band Person educated: Patient Education method: Explanation and Demonstration Education comprehension: verbalized understanding  HOME EXERCISE PROGRAM: 7/11: AA/ROM 7/15: A/ROM 7/18: Scapular Strengthening 7/29: Shoulder Strengthening  GOALS: Goals reviewed with patient? Yes  SHORT TERM GOALS: Target date: 05/14/23  Pt will be provided and educated on HEP to improve RUE mobility for ADL completion.   Goal status: IN PROGRESS  2.  Pt will decrease pain to 2/10 in RUE in order to sleep 2+ consecutive hours without waking due to pain.   Goal status: IN PROGRESS  3.  Pt will decrease RUE  fascial restrictions to minimal amounts in order to complete reaching tasks with least restrictions/pulling sensation  Goal status: IN PROGRESS  4.  Pt will increase ROM of RUE to [facility] limits in order to reach overhead and behind back during dressing and bathing tasks.   Goal status: IN PROGRESS  5.  Pt will increase strength of RUE to 5/5 in order to complete lifting tasks required during yard work, cooking, and cleaning tasks.   Goal status: IN PROGRESS  ASSESSMENT:  CLINICAL IMPRESSION: This session pt was able to mobilize with improved movement pattern this session, however he continues to be limited to 75% of full ROM actively. He demonstrated audible popping and cracking throughout the session, specifically in his R shoulder. OT added x to v arms and goal post arms for continued work on mobility. Verbal and tactile cuing for positioning and technique throughout session.  PERFORMANCE DEFICITS: in functional skills including ADLs, IADLs, ROM, strength, pain, fascial restrictions, muscle spasms, Gross motor control, body mechanics, and UE functional use.   PLAN:  OT FREQUENCY:  2x/week  OT DURATION: 4 weeks  PLANNED INTERVENTIONS: self care/ADL training, therapeutic exercise, therapeutic activity, manual therapy, passive range of motion, functional mobility training, electrical stimulation, moist heat, patient/family education, coping strategies training, and DME and/or AE instructions  RECOMMENDED OTHER SERVICES: N/a  CONSULTED AND AGREED WITH PLAN OF CARE: Patient  PLAN FOR NEXT SESSION: Manual Therapy, P/ROM, Isometrics, AA/ROM, wall slides, ball rolls, A/ROM, Scapular Strengthening   Wong Steadham Bing Plume, OTR/L [facility] [phone] Chastelyn Athens Rosemarie Beath, OT 05/05/2023, 2:26 PM

## 2023-05-07 ENCOUNTER — Ambulatory Visit (HOSPITAL_COMMUNITY): Payer: Medicaid Other | Attending: Family Medicine | Admitting: Occupational Therapy

## 2023-05-07 ENCOUNTER — Encounter (HOSPITAL_COMMUNITY): Payer: Self-pay | Admitting: Occupational Therapy

## 2023-05-07 DIAGNOSIS — M25611 Stiffness of right shoulder, not elsewhere classified: Secondary | ICD-10-CM | POA: Diagnosis present

## 2023-05-07 DIAGNOSIS — R29898 Other symptoms and signs involving the musculoskeletal system: Secondary | ICD-10-CM | POA: Insufficient documentation

## 2023-05-07 DIAGNOSIS — M25511 Pain in right shoulder: Secondary | ICD-10-CM | POA: Insufficient documentation

## 2023-05-07 NOTE — Therapy (Signed)
OUTPATIENT OCCUPATIONAL THERAPY ORTHO RE ASSESSMENT AND TREATMENT NOTE  Patient Name: Joel Castillo MRN: [id number] DOB:Jun 30, 1961, 62 y.o., male Today's Date: 05/07/2023  PCP: Seward Carol, NP REFERRING PROVIDER: Ramond Marrow, MD  END OF SESSION:  OT End of Session - 05/07/23 1159     Visit Number 9    Number of Visits 9    Authorization Type [employer] Medicaid    Authorization Time Period 27 visits    Authorization - Visit Number 8    Authorization - Number of Visits 27    Progress Note Due on Visit 10    OT Start Time 1115    OT Stop Time 1200    OT Time Calculation (min) 45 min    Activity Tolerance Patient tolerated treatment well    Behavior During Therapy [facility] for tasks assessed/performed              Past Medical History:  Diagnosis Date   Chronic abdominal pain    Chronic back pain    Chronic bronchitis    Chronic nausea    Complication of anesthesia 11/22/1998   sat O2 dropped during surgery   COPD (chronic obstructive pulmonary disease) (HCC)    Depression    GERD (gastroesophageal reflux disease)    H. pylori infection 03/05/2012   treated with prevpac   Hyperplastic colon polyp    Hypertension    Lumbago    Myocardial infarction (HCC)    2018   Rectal bleeding    Shortness of breath    Sigmoid diverticulosis    Tubulovillous adenoma    Past Surgical History:  Procedure Laterality Date   ANTERIOR CERVICAL DECOMP/DISCECTOMY FUSION N/A 12/17/2022   Procedure: ANTERIOR CERVICAL DISCECTOMY AND FUSION CERVICAL FIVE-SIX/CERVICAL SIX-SEVEN;  Surgeon: Lisbeth Renshaw, MD;  Location: [facility] OR;  Service: Neurosurgery;  Laterality: N/A;   BACK SURGERY  11/22/1998   COLONOSCOPY  03/24/12   Dr. Jena Gauss- multiple rectal and colonic polyps= tubulovillous adenoma, tular adenoma and hyperplastic polyps ,  sigmoid diverticulosis, poor prep   ESOPHAGOGASTRODUODENOSCOPY  03/24/12   Dr. Jena Gauss- patulous EG junction, moderate hiatal hernia, abnormal gastric mucosa, duodenal  erosions, +hpylori   FINGER SURGERY     Patient Active Problem List   Diagnosis Date Noted   Cervical spondylosis with radiculopathy 12/17/2022   Rectal bleeding 02/25/2012   FH: colon cancer 02/25/2012   Epigastric pain 02/25/2012   GERD (gastroesophageal reflux disease) 02/25/2012   Chest pain 02/25/2012    ONSET DATE: ~4 years  REFERRING DIAG: Right Shoulder Tear  THERAPY DIAG:  Right shoulder pain, unspecified chronicity  Shoulder stiffness, right  Other symptoms and signs involving the musculoskeletal system  Rationale for Evaluation and Treatment: Rehabilitation  SUBJECTIVE:   SUBJECTIVE STATEMENT: "It's just bothering me all the time." Pt accompanied by: self  PERTINENT HISTORY: No past medical history on file. Pt reporting pain for the past 4 years with intermittent and worsening pain in his right shoulder.  PRECAUTIONS: None  WEIGHT BEARING RESTRICTIONS: No  PAIN:  Are you having pain? Yes: NPRS scale: 6/10 Pain location: anterior shoulder girdle Pain description: sharp and aching Aggravating factors: movement and constant Relieving factors: nothing  FALLS: Has patient fallen in last 6 months? No  LIVING ENVIRONMENT: Lives with: lives alone Lives in: Mobile home  PLOF: Independent  PATIENT GOALS: To get out of pain  NEXT MD VISIT: unsure  OBJECTIVE:   HAND DOMINANCE: Right  ADLs: Overall ADLs: Severe pain with dressing and bathing, complete's pericare  with LUE. typically tries not to use RUE for cooking and cleaning, unable to lift objects due to pain.   FUNCTIONAL OUTCOME MEASURES: Upper Extremity Functional Scale (UEFS): 71.3%  UPPER EXTREMITY ROM:     Active ROM Right eval Right 05/07/23  Shoulder flexion 62 105  Shoulder abduction 59 95  Shoulder internal rotation 90 90  Shoulder external rotation 15 40  (Blank rows = not tested)   UPPER EXTREMITY MMT:     MMT Right eval Right 05/07/2023  Shoulder flexion 4/5 4+/5  Shoulder  abduction 3+/5 4/5  Shoulder adduction 3/5 5/5  Shoulder extension 4-/5 4/5  Shoulder internal rotation 3/5 4+/5  Shoulder external rotation 3+/5 4+/5  (Blank rows = not tested)  HAND FUNCTION: Grip strength: Right: 54 lbs; Left: 90 lbs  Right: 75 lbs; Left 85lbs (05/07/2023)  SENSATION: Pt reports tingly sensation along entire RUE  OBSERVATIONS: moderate fascial restrictions along the biceps, trapezius, and anterior shoulder girdle.   TODAY'S TREATMENT:                                                                                                                              DATE:   05/07/23 -Manual Therapy: myofascial release and trigger point applied to biceps, scapular region, trapzius, and deltoid, in order to address fascial restrictions and pain to improve ROM.  -A/ROM: seated, flexion, abduction, protraction, horizontal abduction, er/IR, x15 -X to V arms, x12 -UBE bike: 2 mins forwards, 2 mins backwards 2 pace   05/05/23 -Manual Therapy: myofascial release and trigger point applied to biceps, scapular region, trapzius, and deltoid, in order to address fascial restrictions and pain to improve ROM.  -A/ROM: seated, flexion, abduction, protraction, horizontal abduction, er/IR, x15 -X to V arms, x12 -Goal Post arms, x12 -Shoulder strengthening: 2lb dumbbells, flexion, abduction, protraction, horizontal abduction, er/IR,   05/03/23 -Manual Therapy: myofascial release and trigger point applied to biceps, scapular region, trapzius, and deltoid, in order to address fascial restrictions and pain to improve ROM.  -A/ROM: seated, flexion, abduction, protraction, horizontal abduction, er/IR, x12 -Wall Slides: flexion, abduction, x10 -Scapular strengthening: green band, extension, retraction, rows, x15 -Shoulder Strengthening: green band, horizontal abduction, er, IR, flexion, abduction, x15 -functional reaching: counter to shoulder height, to head height shelf, x10 cones in flexion,  bringing them down in abduction    PATIENT EDUCATION: Education details: Public relations account executive w/ band Person educated: Patient Education method: Explanation and Demonstration Education comprehension: verbalized understanding  HOME EXERCISE PROGRAM: 7/11: AA/ROM 7/15: A/ROM 7/18: Scapular Strengthening 7/29: Shoulder Strengthening  GOALS: Goals reviewed with patient? Yes  SHORT TERM GOALS: Target date: 05/14/23  Pt will be provided and educated on HEP to improve RUE mobility for ADL completion.   Goal status: IN PROGRESS  2.  Pt will decrease pain to 2/10 in RUE in order to sleep 2+ consecutive hours without waking due to pain.   Goal status: IN PROGRESS  3.  Pt will decrease RUE fascial  restrictions to minimal amounts in order to complete reaching tasks with least restrictions/pulling sensation  Goal status: IN PROGRESS  4.  Pt will increase ROM of RUE to [facility] limits in order to reach overhead and behind back during dressing and bathing tasks.   Goal status: MET  5.  Pt will increase strength of RUE to 5/5 in order to complete lifting tasks required during yard work, cooking, and cleaning tasks.   Goal status: PARTIALLY MET  ASSESSMENT:  CLINICAL IMPRESSION: Pt reports continuing to experience 10/10 pain in R shoulder. He does not have a follow up MD appt at this time, we discussed putting a hold on therapy due to no improvement with pain. Pt has made improvements with strength and ROM but has increased pain levels. Completed re assessment with ROM and strength- pt and increased all ROM levels and strength but is continuously limited by pain. Pt will f/u with OP clinic once he sees MD.    PERFORMANCE DEFICITS: in functional skills including ADLs, IADLs, ROM, strength, pain, fascial restrictions, muscle spasms, Gross motor control, body mechanics, and UE functional use.   PLAN:  OT FREQUENCY: 2x/week  OT DURATION: 4 weeks  PLANNED INTERVENTIONS: self care/ADL  training, therapeutic exercise, therapeutic activity, manual therapy, passive range of motion, functional mobility training, electrical stimulation, moist heat, patient/family education, coping strategies training, and DME and/or AE instructions  RECOMMENDED OTHER SERVICES: N/a  CONSULTED AND AGREED WITH PLAN OF CARE: Patient  PLAN FOR NEXT SESSION: Manual Therapy, P/ROM, Isometrics, AA/ROM, wall slides, ball rolls, A/ROM, Scapular Strengthening   [facility] [phone] Bevelyn Ngo, OT 05/07/2023, 11:59 AM

## 2023-05-18 ENCOUNTER — Ambulatory Visit: Admission: RE | Admit: 2023-05-18 | Payer: Medicaid Other | Source: Ambulatory Visit

## 2023-05-18 DIAGNOSIS — Z01818 Encounter for other preprocedural examination: Secondary | ICD-10-CM

## 2023-07-16 ENCOUNTER — Ambulatory Visit (INDEPENDENT_AMBULATORY_CARE_PROVIDER_SITE_OTHER): Payer: Medicaid Other | Admitting: Internal Medicine

## 2023-07-16 ENCOUNTER — Encounter: Payer: Self-pay | Admitting: Internal Medicine

## 2023-07-16 VITALS — BP 126/85 | HR 110 | Ht 69.0 in | Wt 159.0 lb

## 2023-07-16 DIAGNOSIS — R0609 Other forms of dyspnea: Secondary | ICD-10-CM | POA: Diagnosis not present

## 2023-07-16 DIAGNOSIS — F17210 Nicotine dependence, cigarettes, uncomplicated: Secondary | ICD-10-CM

## 2023-07-16 DIAGNOSIS — R911 Solitary pulmonary nodule: Secondary | ICD-10-CM | POA: Insufficient documentation

## 2023-07-16 MED ORDER — TRELEGY ELLIPTA 100-62.5-25 MCG/ACT IN AEPB: 1.0000 | INHALATION_SPRAY | Freq: Once | RESPIRATORY_TRACT | Status: AC

## 2023-07-16 NOTE — Progress Notes (Signed)
Joel Castillo, male    DOB: Oct 19, 1960    MRN: [id number]   Brief patient profile:  59  yowm  active smoker  referred to pulmonary clinic in [place]  07/16/2023 by Joel Carol NP  for progressive doe x 2014 ? COPD and SPN indicidentally discovered on Shoulder CT 05/18/23 x 1 cm spiculated       History of Present Illness  07/16/2023  Pulmonary/ 1st office eval/ Joel Castillo /  Office  Chief Complaint  Patient presents with   Consult  Dyspnea:  food lion x sev aisles / gets let off at door  Cough: worse in am/ light green x 7 months prior to OV  - never heme Sleep: flat bed with 3 pillows  SABA use: none lately  02: none   No obvious day to day or daytime pattern/variability or assoc excess/ purulent sputum or mucus plugs or hemoptysis or cp or chest tightness, subjective wheeze or overt sinus or hb symptoms.    Also denies any obvious fluctuation of symptoms with weather or environmental changes or other aggravating or alleviating factors except as outlined above   No unusual exposure hx or h/o childhood pna/ asthma or knowledge of premature birth.  Current Allergies, Complete Past Medical History, Past Surgical History, Family History, and Social History were reviewed in [employer] record.  ROS  The following are not active complaints unless bolded Hoarseness, sore throat, dysphagia, dental problems, itching, sneezing,  nasal congestion or discharge of excess mucus or purulent secretions, ear ache,   fever, chills, sweats, unintended wt loss or wt gain, classically pleuritic or exertional cp,  orthopnea pnd or arm/hand swelling  or leg swelling, presyncope, palpitations, abdominal pain, anorexia, nausea, vomiting, diarrhea  or change in bowel habits or change in bladder habits, change in stools or change in urine, dysuria, hematuria,  rash, arthralgias, visual complaints, headache, numbness, weakness or ataxia or problems with walking or coordination,   change in mood or  memory.              Outpatient Medications Prior to Visit  Medication Sig Dispense Refill   aspirin EC 81 MG tablet Take 1 tablet (81 mg total) by mouth daily. 30 tablet 12   cyclobenzaprine (FLEXERIL) 10 MG tablet Take 10 mg by mouth 2 (two) times daily as needed for muscle spasms.     LINZESS 290 MCG CAPS capsule Take 290 mcg by mouth daily.     mirtazapine (REMERON) 15 MG tablet Take 15 mg by mouth at bedtime.     naloxone (NARCAN) nasal spray 4 mg/0.1 mL Place 1 spray into the nose as needed (opioid overdose).     oxyCODONE-acetaminophen (PERCOCET) 10-325 MG tablet Take 1 tablet by mouth 4 (four) times daily as needed for pain.     pregabalin (LYRICA) 200 MG capsule Take 200 mg by mouth every 8 (eight) hours.     topiramate (TOPAMAX) 100 MG tablet Take 100 mg by mouth daily with lunch.     albuterol (PROVENTIL HFA;VENTOLIN HFA) 108 (90 BASE) MCG/ACT inhaler Inhale 1-2 puffs into the lungs every 6 (six) hours as needed for shortness of breath or wheezing. (Patient not taking: Reported on 07/16/2023)     atorvastatin (LIPITOR) 40 MG tablet Take 40 mg by mouth daily with lunch. (Patient not taking: Reported on 07/16/2023)     buPROPion (WELLBUTRIN SR) 150 MG 12 hr tablet Take by mouth.     methocarbamol (ROBAXIN) 500 MG tablet Take  1 tablet (500 mg total) by mouth every 6 (six) hours as needed for muscle spasms. (Patient not taking: Reported on 07/16/2023) 60 tablet 0   No facility-administered medications prior to visit.    Past Medical History:  Diagnosis Date   Chronic abdominal pain    Chronic back pain    Chronic bronchitis    Chronic nausea    Complication of anesthesia 11/22/1998   sat O2 dropped during surgery   COPD (chronic obstructive pulmonary disease) (HCC)    Depression    GERD (gastroesophageal reflux disease)    H. pylori infection 03/05/2012   treated with prevpac   Hyperplastic colon polyp    Hypertension    Lumbago    Myocardial  infarction (HCC)    2018   Rectal bleeding    Shortness of breath    Sigmoid diverticulosis    Tubulovillous adenoma       Objective:     BP 126/85   Pulse (!) 110   Ht 5\' 9"  (1.753 m)   Wt 159 lb (72.1 kg)   SpO2 94% Comment: RA  BMI 23.48 kg/m   SpO2: 94 % (RA)  Wt Readings from Last 3 Encounters:  07/16/23 159 lb (72.1 kg)  12/17/22 152 lb (68.9 kg)  10/04/19 199 lb 3.2 oz (90.4 kg)     Amb elderly wm nad appears >stated age   HEENT :  Oropharynx  clear/ edentulous  Nasal turbinates  nl    NECK :  without JVD/Nodes/TM/ nl carotid upstrokes bilaterally   LUNGS: no acc muscle use,  Mod barrel  contour chest wall with bilateral  Distant bs s audible wheeze and  without cough on insp or exp maneuvers and mod  Hyperresonant  to  percussion bilaterally     CV:  RRR  no s3 or murmur or increase in P2, and no edema   ABD:  soft and nontender with pos mid insp Hoover's  in the supine position. No bruits or organomegaly appreciated, bowel sounds nl  MS:   Ext warm without deformities or   obvious joint restrictions , calf tenderness, cyanosis or clubbing  SKIN: warm and dry without lesions    NEURO:  alert, approp, nl sensorium with  no motor or cerebellar deficits apparent.              Assessment   Solitary pulmonary nodule on lung CT Active smoker  - Shoulder CT 05/18/23 x 1 cm spiculated  > rec f/u ct 08/19/23 whole lung   Given smoking hx concern is early stage bronchogenic ca, I doubt operable given appearance of advanced copd so if any progression will need PET and consideration for navigational bx/ SRT   Discussed in detail all the  indications, usual  risks and alternatives  relative to the benefits with patient who agrees to proceed with w/u as outlined.     DOE (dyspnea on exertion) Active smoker with slow progression and severe copd by hx / exam  - PFTs rec  07/16/2023 >>> - 07/16/2023  After extensive coaching inhaler device,  effectiveness =   80% with dpi > try trelegy 100 sample  x 6 weeks and return to eval response.  Pt is Group B in terms of symptom/risk and laba/lama therefore appropriate rx at this point >>>  we don't have samples of a laba/lama so will give him what's avaialbe in the triple rx and ask him to work hard on stopping smoking.  Each maintenance medication was reviewed in detail including emphasizing most importantly the difference between maintenance and prns and under what circumstances the prns are to be triggered using an action plan format where appropriate.  Total time for H and P, chart review, counseling, reviewing dpi  device(s) and generating customized AVS unique to this office visit / same day charting = 31 min new pt eval     Cigarette smoker 4-5 min discussion re active cigarette smoking in addition to office E&M  Ask about tobacco use:   ongoing Advise quitting   I took an extended  opportunity with this patient to outline the consequences of continued cigarette use  in airway disorders based on all the data we have from the multiple national lung health studies (perfomed over decades at millions of dollars in cost)  indicating that smoking cessation, not choice of inhalers or pulmonary physicians, is the most important aspect of his care.   Assess willingness:  Not committed at this point Assist in quit attempt:  Suggested e-cigs as an optional  "one way bridge"  Off all tobacco products  Arrange follow up:   Follow up per Primary Care planned                Sandrea Hughs, MD 07/16/2023

## 2023-07-16 NOTE — Patient Instructions (Addendum)
Suggested e-cigs as an optional  "one way bridge"  Off all tobacco products   Start Trelegy 100 is one click first thing each am and take two deep solid drags and breathe out thru your nose -  rinse mouth after use - if irritates mouth or throat brush tongue with arm and hammer tootpaste and make slurry with water swish/ gargle and spit out   My office will be contacting you by phone for referral to CT chest around middle of November 2024   - if you don't hear back from my office within one week please call us back or notify us thru MyChart and we'll address it right away.   Please schedule a follow up office visit in 6 weeks, call sooner if needed with all medications /inhalers/ solutions in hand so we can verify exactly what you are taking. This includes all medications from all doctors and over the counters    Late add: needs pfts next available.

## 2023-07-17 ENCOUNTER — Telehealth: Payer: Self-pay | Admitting: Internal Medicine

## 2023-07-17 ENCOUNTER — Encounter: Payer: Self-pay | Admitting: Internal Medicine

## 2023-07-17 DIAGNOSIS — R0609 Other forms of dyspnea: Secondary | ICD-10-CM | POA: Insufficient documentation

## 2023-07-17 DIAGNOSIS — F17210 Nicotine dependence, cigarettes, uncomplicated: Secondary | ICD-10-CM | POA: Insufficient documentation

## 2023-07-17 NOTE — Assessment & Plan Note (Addendum)
Active smoker with slow progression and severe copd by hx / exam  - PFTs rec  07/16/2023 >>> - 07/16/2023  After extensive coaching inhaler device,  effectiveness =  80% with dpi > try trelegy 100 sample  x 6 weeks and return to eval response.  Pt is Group B in terms of symptom/risk and laba/lama therefore appropriate rx at this point >>>  we don't have samples of a laba/lama so will give him what's avaialbe in the triple rx and ask him to work hard on stopping smoking.     Each maintenance medication was reviewed in detail including emphasizing most importantly the difference between maintenance and prns and under what circumstances the prns are to be triggered using an action plan format where appropriate.  Total time for H and P, chart review, counseling, reviewing dpi  device(s) and generating customized AVS unique to this office visit / same day charting = 31 min new pt eval

## 2023-07-17 NOTE — Assessment & Plan Note (Addendum)
4-5 min discussion re active cigarette smoking in addition to office E&M  Ask about tobacco use:   ongoing Advise quitting   I took an extended  opportunity with this patient to outline the consequences of continued cigarette use  in airway disorders based on all the data we have from the multiple national lung health studies (perfomed over decades at millions of dollars in cost)  indicating that smoking cessation, not choice of inhalers or pulmonary physicians, is the most important aspect of his care.   Assess willingness:  Not committed at this point Assist in quit attempt:  Suggested e-cigs as an optional  "one way bridge"  Off all tobacco products  Arrange follow up:   Follow up per Primary Care planned

## 2023-07-17 NOTE — Telephone Encounter (Signed)
Needs pfts scheduled dx doe

## 2023-07-17 NOTE — Assessment & Plan Note (Addendum)
Active smoker  - Shoulder CT 05/18/23 x 1 cm spiculated  > rec f/u ct 08/19/23 whole lung   Given smoking hx concern is early stage bronchogenic ca, I doubt operable given appearance of advanced copd so if any progression will need PET and consideration for navigational bx/ SRT   Discussed in detail all the  indications, usual  risks and alternatives  relative to the benefits with patient who agrees to proceed with w/u as outlined.

## 2023-07-20 NOTE — Addendum Note (Signed)
Addended by: Winn Jock on: 07/20/2023 11:58 AM   Modules accepted: Orders

## 2023-07-20 NOTE — Telephone Encounter (Signed)
Order was placed for pft

## 2023-08-19 ENCOUNTER — Ambulatory Visit (HOSPITAL_COMMUNITY)
Admission: RE | Admit: 2023-08-19 | Discharge: 2023-08-19 | Disposition: A | Discharge status: Home / Self Care | Payer: Medicaid Other | Source: Ambulatory Visit | Attending: Internal Medicine | Admitting: Internal Medicine

## 2023-08-19 DIAGNOSIS — R911 Solitary pulmonary nodule: Secondary | ICD-10-CM | POA: Insufficient documentation

## 2023-08-27 ENCOUNTER — Ambulatory Visit: Payer: Medicaid Other | Admitting: Internal Medicine

## 2023-09-05 NOTE — Progress Notes (Unsigned)
Joel Castillo, male    DOB: [birth year]    MRN: [id number]   Brief patient profile:  27  yowm  active smoker  referred to pulmonary clinic in [place]  07/16/2023 by Joel Carol NP  for progressive doe x 2014 ? COPD and SPN indicidentally discovered on Shoulder CT 05/18/23 x 1 cm spiculated       History of Present Illness  07/16/2023  Pulmonary/ 1st office eval/ Joel Castillo / [place] [facility]  Chief Complaint  Patient presents with   Consult  Dyspnea:  food lion x sev aisles / gets let off at door  Cough: worse in am/ light green x 7 months prior to OV  - never heme Sleep: flat bed with 3 pillows  SABA use: none lately  02: none  Rec Suggested e-cigs as an optional  "one way bridge"  Off all tobacco products  Start Trelegy 100 is one click first thing each am  Please schedule a follow up office visit in 6 weeks, call sooner if needed with all medications /inhalers/ solutions in hand    Late add: needs pfts next available > not done as of 09/06/2023    09/06/2023  f/u ov/Joel Castillo office/Joel Castillo re: MPN maint on no rx  did not bring meds Chief Complaint  Patient presents with   Lung Nodule   Dyspnea:  no change food lion x one or two aisles then stops due to sob/ not sure whether trelegy or saba helps or not  Cough: 1st thing am mucoid  Sleeping: bed is flat with several pillows n   resp cc  SABA use: none  02: none    No obvious day to day or daytime variability or assoc excess/ purulent sputum or mucus plugs or hemoptysis or cp or chest tightness, subjective wheeze or overt sinus or hb symptoms.    Also denies any obvious fluctuation of symptoms with weather or environmental changes or other aggravating or alleviating factors except as outlined above   No unusual exposure hx or h/o childhood pna/ asthma or knowledge of premature birth.  Current Allergies, Complete Past Medical History, Past Surgical History, Family History, and Social History were reviewed in [employer] record.  ROS  The following are not active complaints unless bolded Hoarseness, sore throat, dysphagia, dental problems, itching, sneezing,  nasal congestion or discharge of excess mucus or purulent secretions, ear ache,   fever, chills, sweats, unintended wt loss or wt gain, classically pleuritic or exertional cp,  orthopnea pnd or arm/hand swelling  or leg swelling, presyncope, palpitations, abdominal pain, anorexia, nausea, vomiting, diarrhea  or change in bowel habits or change in bladder habits, change in stools or change in urine, dysuria, hematuria,  rash, arthralgias, visual complaints, headache, numbness, weakness or ataxia or problems with walking or coordination,  change in mood or  memory.        Current Meds  Medication Sig   albuterol (PROVENTIL HFA;VENTOLIN HFA) 108 (90 BASE) MCG/ACT inhaler Inhale 1-2 puffs into the lungs every 6 (six) hours as needed for shortness of breath or wheezing.   aspirin EC 81 MG tablet Take 1 tablet (81 mg total) by mouth daily.   buPROPion (WELLBUTRIN SR) 150 MG 12 hr tablet Take by mouth.   cyclobenzaprine (FLEXERIL) 10 MG tablet Take 10 mg by mouth 2 (two) times daily as needed for muscle spasms.   LINZESS 290 MCG CAPS capsule Take 290 mcg by mouth daily.   mirtazapine (REMERON) 15 MG  tablet Take 15 mg by mouth at bedtime.   naloxone (NARCAN) nasal spray 4 mg/0.1 mL Place 1 spray into the nose as needed (opioid overdose).   oxyCODONE-acetaminophen (PERCOCET) 10-325 MG tablet Take 1 tablet by mouth 4 (four) times daily as needed for pain.   pregabalin (LYRICA) 200 MG capsule Take 200 mg by mouth every 8 (eight) hours.   topiramate (TOPAMAX) 100 MG tablet Take 100 mg by mouth daily with lunch.          Past Medical History:  Diagnosis Date   Chronic abdominal pain    Chronic back pain    Chronic bronchitis    Chronic nausea    Complication of anesthesia 11/22/1998   sat O2 dropped during surgery   COPD (chronic obstructive  pulmonary disease) (HCC)    Depression    GERD (gastroesophageal reflux disease)    H. pylori infection 03/05/2012   treated with prevpac   Hyperplastic colon polyp    Hypertension    Lumbago    Myocardial infarction (HCC)    2018   Rectal bleeding    Shortness of breath    Sigmoid diverticulosis    Tubulovillous adenoma       Objective:    wts  09/06/2023       168    07/16/23 159 lb (72.1 kg)  12/17/22 152 lb (68.9 kg)  10/04/19 199 lb 3.2 oz (90.4 kg)     Vital signs reviewed  09/06/2023  - Note at rest 02 sats  94% on RA   General appearance:    disheveled wm > stated age, very easily confused with details of care    HEENT : Oropharynx  clear/ edentulous       NECK :  without  apparent JVD/ palpable Nodes/TM    LUNGS: no acc muscle use,  Min barrel  contour chest wall with bilateral  slightly decreased bs s audible wheeze and  without cough on insp or exp maneuvers and min  Hyperresonant  to  percussion bilaterally    CV:  RRR  no s3 or murmur or increase in P2, and no edema   ABD:  soft and nontender with pos end  insp Hoover's  in the supine position.  No bruits or organomegaly appreciated   MS:  Nl gait/ ext warm without deformities Or obvious joint restrictions  calf tenderness, cyanosis or clubbing     SKIN: warm and dry without lesions    NEURO:  alert, approp, nl sensorium with  no motor or cerebellar deficits apparent.            I personally reviewed images and agree with radiology impression as follows:   Chest CT 08/19/23   s contrast  Spiculated nodule in the inferior right upper lobe is essentially stable since prior study, 9 mm compared to 10 mm previously.   Clustered nodules in the right lower lobe with conglomerate nodular area measuring up to 1.9 cm. Scattered left lower lobe pulmonary nodules measuring up to 5 mm.    Assessment

## 2023-09-06 ENCOUNTER — Ambulatory Visit (INDEPENDENT_AMBULATORY_CARE_PROVIDER_SITE_OTHER): Payer: Medicaid Other | Admitting: Internal Medicine

## 2023-09-06 ENCOUNTER — Encounter: Payer: Self-pay | Admitting: Internal Medicine

## 2023-09-06 VITALS — BP 125/77 | HR 102 | Ht 69.0 in | Wt 168.0 lb

## 2023-09-06 DIAGNOSIS — F17210 Nicotine dependence, cigarettes, uncomplicated: Secondary | ICD-10-CM | POA: Diagnosis not present

## 2023-09-06 DIAGNOSIS — R0609 Other forms of dyspnea: Secondary | ICD-10-CM | POA: Diagnosis not present

## 2023-09-06 DIAGNOSIS — R911 Solitary pulmonary nodule: Secondary | ICD-10-CM | POA: Diagnosis not present

## 2023-09-06 NOTE — Assessment & Plan Note (Signed)
Active smoker  - Shoulder CT 05/18/23 x 1 cm spiculated  > rec f/u ct 08/19/23 whole lung  - CT chest   10/18/22  Spiculated nodule in the inferior right upper lobe is essentially stable since prior study, 9 mm compared to 10 mm previously. Clustered nodules in the right lower lobe with conglomerate nodular area measuring up to 1.9 cm. Scattered left lower lobe pulmonary nodules measuring up to 5 mm Non-contrast chest CT at  6 months is recommended as he is high risk >>> order for 02/16/24   Discussed in detail all the  indications, usual  risks and alternatives  relative to the benefits with patient who agrees to proceed with w/u as outlined.

## 2023-09-06 NOTE — Patient Instructions (Addendum)
The key is to stop smoking completely before smoking completely stops you!  Be sure to go for your lung function tests Oct 07 2023 and I will call results   My office will be contacting you by phone for referral to CT chest in 02/16/23   - if you don't hear back from my office within one week please call us back or notify us thru MyChart and we'll address it right away.   I will see you back here if your breathing or cough bother you but you must bring all your medications with you including over the counters

## 2023-09-06 NOTE — Assessment & Plan Note (Signed)
Active smoker with slow progression and   copd by hx / exam  - PFTs rec  07/16/2023 >>> due Oct 07 2023  - 07/16/2023  After extensive coaching inhaler device,  effectiveness =  80% with dpi > try trelegy 100 sample  x 6 weeks and return to eval response > says no change on vs off but really not very active  - 09/06/2023   Walked on RA  x  3  lap(s) =  approx 450  ft  @ mod pace, stopped due to end of study  with lowest 02 sats 94% s sob      I reviewed the Fletcher curve with the patient that basically indicates that  if you quit smoking when your best day FEV1 is still  preserved (as is may still turn out to be relatively true   here)  it is highly unlikely you will progress to severe disease and informed the patient there was  no medication on the market that has proven to alter the curve/ its downward trajectory  or the likelihood of progression of their disease(unlike other chronic medical conditions such as atheroclerosis where we do think we can change the natural hx with risk reducing meds)    Therefore stopping smoking and maintaining abstinence are  the most important aspects of his care, not choice of inhalers or for that matter, Pulmonary doctors.   Treatment other than smoking cessation  is entirely directed by severity of symptoms and focused also on reducing exacerbations, not attempting to change the natural history of the disease. Should either issue become problematic, then escalation of maintenance and prn resp meds may be warranted "the punishment needs to fit the crime"    For now no rx needed   Discussed in detail all the  indications, usual  risks and alternatives  relative to the benefits with patient who agrees to proceed with conservative f/u as outlined

## 2023-09-06 NOTE — Assessment & Plan Note (Signed)
Counseled re importance of smoking cessation but did not meet time criteria for separate billing   ° ° °Each maintenance medication was reviewed in detail including emphasizing most importantly the difference between maintenance and prns and under what circumstances the prns are to be triggered using an action plan format where appropriate. ° °Total time for H and P, chart review, counseling,  directly observing portions of ambulatory 02 saturation study/ and generating customized AVS unique to this office visit / same day charting = 30 min  °     °  °       °

## 2023-10-07 ENCOUNTER — Ambulatory Visit (HOSPITAL_COMMUNITY)
Admission: RE | Admit: 2023-10-07 | Discharge: 2023-10-07 | Disposition: A | Discharge status: Home / Self Care | Payer: Medicaid Other | Source: Ambulatory Visit | Attending: Internal Medicine | Admitting: Internal Medicine

## 2023-10-07 DIAGNOSIS — F17210 Nicotine dependence, cigarettes, uncomplicated: Secondary | ICD-10-CM | POA: Diagnosis present

## 2023-10-07 DIAGNOSIS — R0609 Other forms of dyspnea: Secondary | ICD-10-CM | POA: Insufficient documentation

## 2023-10-07 LAB — PULMONARY FUNCTION TEST
DL/VA % pred: 58 %
DL/VA: 2.45 ml/min/mmHg/L
DLCO unc % pred: 44 %
DLCO unc: 11.8 ml/min/mmHg
FEF 25-75 Post: 0.86 L/s
FEF 25-75 Pre: 0.43 L/s
FEF2575-%Change-Post: 101 %
FEF2575-%Pred-Post: 30 %
FEF2575-%Pred-Pre: 15 %
FEV1-%Change-Post: 26 %
FEV1-%Pred-Post: 39 %
FEV1-%Pred-Pre: 30 %
FEV1-Post: 1.35 L
FEV1-Pre: 1.07 L
FEV1FVC-%Change-Post: -6 %
FEV1FVC-%Pred-Pre: 64 %
FEV6-%Change-Post: 33 %
FEV6-%Pred-Post: 62 %
FEV6-%Pred-Pre: 47 %
FEV6-Post: 2.74 L
FEV6-Pre: 2.06 L
FEV6FVC-%Change-Post: -3 %
FEV6FVC-%Pred-Post: 96 %
FEV6FVC-%Pred-Pre: 100 %
FVC-%Change-Post: 35 %
FVC-%Pred-Post: 65 %
FVC-%Pred-Pre: 47 %
FVC-Post: 2.98 L
FVC-Pre: 2.19 L
Post FEV1/FVC ratio: 45 %
Post FEV6/FVC ratio: 92 %
Pre FEV1/FVC ratio: 49 %
Pre FEV6/FVC Ratio: 96 %
RV % pred: 265 %
RV: 5.86 L
TLC % pred: 124 %
TLC: 8.49 L

## 2023-10-07 MED ORDER — ALBUTEROL SULFATE (2.5 MG/3ML) 0.083% IN NEBU
2.5000 mg | INHALATION_SOLUTION | Freq: Once | RESPIRATORY_TRACT | Status: AC
  Administered 2023-10-07: 2.5 mg via RESPIRATORY_TRACT

## 2023-11-03 ENCOUNTER — Telehealth: Payer: Self-pay | Admitting: Internal Medicine

## 2023-11-03 NOTE — Telephone Encounter (Signed)
Called spoke with patient regarding his results of his PFT , pt was sent a my chart message regarding this ,pt was unware of message in my chart acct. Informed pt of his results. Per result note.made an appt for to come in discuss results. Mail out letter  for patient regarding his appt per patient request.

## 2023-11-03 NOTE — Telephone Encounter (Signed)
Patient would like the results of the breathing test he had on 10/07/23----[phone]

## 2023-11-10 ENCOUNTER — Encounter: Payer: Self-pay | Admitting: Internal Medicine

## 2023-11-17 ENCOUNTER — Encounter (INDEPENDENT_AMBULATORY_CARE_PROVIDER_SITE_OTHER): Payer: Self-pay | Admitting: *Deleted

## 2023-11-30 NOTE — Progress Notes (Deleted)
 Joel Castillo, male    DOB: July 28, 1961    MRN: [id number]   Brief patient profile:  50  yowm  active smoker  referred to pulmonary clinic in [place]  07/16/2023 by Seward Carol NP  for progressive doe x 2014 ? COPD and SPN indicidentally discovered on Shoulder CT 05/18/23 x 1 cm spiculated       History of Present Illness  07/16/2023  Pulmonary/ 1st office eval/ Gennavieve Huq / [place] [facility]  Chief Complaint  Patient presents with   Consult  Dyspnea:  food lion x sev aisles / gets let off at door  Cough: worse in am/ light green x 7 months prior to OV  - never heme Sleep: flat bed with 3 pillows  SABA use: none lately  02: none  Rec Suggested e-cigs as an optional  "one way bridge"  Off all tobacco products  Start Trelegy 100 is one click first thing each am  Please schedule a follow up office visit in 6 weeks, call sooner if needed with all medications /inhalers/ solutions in hand    Late add: needs pfts next available > not done as of 09/06/2023    09/06/2023  f/u ov/[place] office/[name] re: MPN maint on no rx  did not bring meds Chief Complaint  Patient presents with   Lung Nodule   Dyspnea:  no change food lion x one or two aisles then stops due to sob/ not sure whether trelegy or saba helps or not  Cough: 1st thing am mucoid  Sleeping: bed is flat with several pillows n   resp cc  SABA use: none  02: none  Rec The key is to stop smoking completely before smoking completely stops you! Be sure to go for your lung function tests Oct 07 2023 and I will call results  My office will be contacting you by phone for referral to CT chest in 02/16/23   - if you don't hear back from my office within one week please call us back or notify us thru MyChart and we'll address it right away.   I will see you back here if your breathing or cough bother you but you must bring all your medications with you including over the counters   12/02/2023  f/u ov/[place] office/[name] re: ***  maint on *** did *** bring meds  No chief complaint on file.   Dyspnea:  *** Cough: *** Sleeping: ***   resp cc  SABA use: *** 02: ***  Lung cancer screening: ***   No obvious day to day or daytime variability or assoc excess/ purulent sputum or mucus plugs or hemoptysis or cp or chest tightness, subjective wheeze or overt sinus or hb symptoms.    Also denies any obvious fluctuation of symptoms with weather or environmental changes or other aggravating or alleviating factors except as outlined above   No unusual exposure hx or h/o childhood pna/ asthma or knowledge of premature birth.  Current Allergies, Complete Past Medical History, Past Surgical History, Family History, and Social History were reviewed in [employer] record.  ROS  The following are not active complaints unless bolded Hoarseness, sore throat, dysphagia, dental problems, itching, sneezing,  nasal congestion or discharge of excess mucus or purulent secretions, ear ache,   fever, chills, sweats, unintended wt loss or wt gain, classically pleuritic or exertional cp,  orthopnea pnd or arm/hand swelling  or leg swelling, presyncope, palpitations, abdominal pain, anorexia, nausea, vomiting, diarrhea  or change in  bowel habits or change in bladder habits, change in stools or change in urine, dysuria, hematuria,  rash, arthralgias, visual complaints, headache, numbness, weakness or ataxia or problems with walking or coordination,  change in mood or  memory.        No outpatient medications have been marked as taking for the 12/02/23 encounter (Appointment) with Nyoka Cowden, MD.           Past Medical History:  Diagnosis Date   Chronic abdominal pain    Chronic back pain    Chronic bronchitis    Chronic nausea    Complication of anesthesia 11/22/1998   sat O2 dropped during surgery   COPD (chronic obstructive pulmonary disease) (HCC)    Depression    GERD (gastroesophageal reflux disease)     H. pylori infection 03/05/2012   treated with prevpac   Hyperplastic colon polyp    Hypertension    Lumbago    Myocardial infarction (HCC)    2018   Rectal bleeding    Shortness of breath    Sigmoid diverticulosis    Tubulovillous adenoma       Objective:    wts  12/02/2023        ***  09/06/2023       168    07/16/23 159 lb (72.1 kg)  12/17/22 152 lb (68.9 kg)  10/04/19 199 lb 3.2 oz (90.4 kg)     Vital signs reviewed  12/02/2023  - Note at rest 02 sats  ***% on ***   General appearance:    ***    edentulous    Min barr***        I personally reviewed images and agree with radiology impression as follows:   Chest CT 08/19/23   s contrast  Spiculated nodule in the inferior right upper lobe is essentially stable since prior study, 9 mm compared to 10 mm previously.   Clustered nodules in the right lower lobe with conglomerate nodular area measuring up to 1.9 cm. Scattered left lower lobe pulmonary nodules measuring up to 5 mm.    Assessment

## 2023-12-01 DIAGNOSIS — J449 Chronic obstructive pulmonary disease, unspecified: Secondary | ICD-10-CM | POA: Insufficient documentation

## 2023-12-02 ENCOUNTER — Ambulatory Visit: Payer: Medicaid Other | Admitting: Internal Medicine

## 2023-12-02 DIAGNOSIS — R0609 Other forms of dyspnea: Secondary | ICD-10-CM

## 2023-12-02 DIAGNOSIS — J449 Chronic obstructive pulmonary disease, unspecified: Secondary | ICD-10-CM

## 2023-12-31 NOTE — Progress Notes (Unsigned)
 Joel Castillo, male    DOB: 07-01-1961    MRN: [id number]   Brief patient profile:  43  yowm  active smoker  referred to pulmonary clinic in [place]  07/16/2023 by Seward Carol NP  for progressive doe x 2014 ? COPD and SPN incidentally discovered on Shoulder CT 05/18/23 x 1 cm spiculated  with GOLD 3 criteria for COPD established 10/2023      History of Present Illness  07/16/2023  Pulmonary/ 1st office eval/ Joel Castillo / [place] [facility]  Chief Complaint  Patient presents with   Consult  Dyspnea:  food lion x sev aisles / gets let off at door  Cough: worse in am/ light green x 7 months prior to OV  - never heme Sleep: flat bed with 3 pillows  SABA use: none lately  02: none  Rec Suggested e-cigs as an optional  "one way bridge"  Off all tobacco products  Start Trelegy 100 is one click first thing each am  Please schedule a follow up office visit in 6 weeks, call sooner if needed with all medications /inhalers/ solutions in hand    Late add: needs pfts next available > not done as of 09/06/2023    09/06/2023  f/u ov/[place] office/Joel Castillo re: MPN maint on no rx  did not bring meds Chief Complaint  Patient presents with   Lung Nodule   Dyspnea:  no change food lion x one or two aisles then stops due to sob/ not sure whether trelegy or saba helps or not  Cough: 1st thing am mucoid  Sleeping: bed is flat with several pillows n   resp cc  SABA use: none  02: none  Rec The key is to stop smoking completely before smoking completely stops you! Be sure to go for your lung function tests Oct 07 2023 and I will call results   if your breathing or cough bother you but you must bring all your medications with you including over the counters   01/03/2024  f/u ov/[place] office/Joel Castillo re:   Gold 3 COPD  maint on nothing did   bring meds - no resp rx  Chief Complaint  Patient presents with   Shortness of Breath  Dyspnea:  stili able to do food lion slow pace  Cough: worse in am > slt   green tint  Sleeping: flat bed / 2 pillows  s resp cc while asleep SABA use: ;none  02: none   Lung cancer screening: due for CT 02/2024    No obvious day to day or daytime variability or assoc excess/ purulent sputum or mucus plugs or hemoptysis or cp or chest tightness, subjective wheeze or overt sinus or hb symptoms.    Also denies any obvious fluctuation of symptoms with weather or environmental changes or other aggravating or alleviating factors except as outlined above   No unusual exposure hx or h/o childhood pna/ asthma or knowledge of premature birth.  Current Allergies, Complete Past Medical History, Past Surgical History, Family History, and Social History were reviewed in [employer] record.  ROS  The following are not active complaints unless bolded Hoarseness, sore throat, dysphagia, dental problems, itching, sneezing,  nasal congestion or discharge of excess mucus or purulent secretions, ear ache,   fever, chills, sweats, unintended wt loss or wt gain, classically pleuritic or exertional cp,  orthopnea pnd or arm/hand swelling  or leg swelling, presyncope, palpitations, abdominal pain, anorexia, nausea, vomiting, diarrhea  or change in bowel  habits or change in bladder habits, change in stools or change in urine, dysuria, hematuria,  rash, arthralgias, visual complaints, headache, numbness, weakness or ataxia or problems with walking or coordination,  change in mood or  memory.        Current Meds  Medication Sig   albuterol (PROVENTIL HFA;VENTOLIN HFA) 108 (90 BASE) MCG/ACT inhaler Inhale 1-2 puffs into the lungs every 6 (six) hours as needed for shortness of breath or wheezing.   aspirin EC 81 MG tablet Take 1 tablet (81 mg total) by mouth daily.   buPROPion (WELLBUTRIN SR) 150 MG 12 hr tablet Take by mouth.   cyclobenzaprine (FLEXERIL) 10 MG tablet Take 10 mg by mouth 2 (two) times daily as needed for muscle spasms.   folic acid (FOLVITE) 1 MG tablet  Take 1 mg by mouth daily.   LINZESS 290 MCG CAPS capsule Take 290 mcg by mouth daily.   metoCLOPramide (REGLAN) 5 MG tablet Take 5 mg by mouth 2 (two) times daily.   mirtazapine (REMERON) 15 MG tablet Take 15 mg by mouth at bedtime.   naloxone (NARCAN) nasal spray 4 mg/0.1 mL Place 1 spray into the nose as needed (opioid overdose).   oxyCODONE-acetaminophen (PERCOCET) 10-325 MG tablet Take 1 tablet by mouth 4 (four) times daily as needed for pain.   pregabalin (LYRICA) 200 MG capsule Take 200 mg by mouth every 8 (eight) hours.   topiramate (TOPAMAX) 100 MG tablet Take 100 mg by mouth daily with lunch.           Past Medical History:  Diagnosis Date   Chronic abdominal pain    Chronic back pain    Chronic bronchitis    Chronic nausea    Complication of anesthesia 11/22/1998   sat O2 dropped during surgery   COPD (chronic obstructive pulmonary disease) (HCC)    Depression    GERD (gastroesophageal reflux disease)    H. pylori infection 03/05/2012   treated with prevpac   Hyperplastic colon polyp    Hypertension    Lumbago    Myocardial infarction (HCC)    2018   Rectal bleeding    Shortness of breath    Sigmoid diverticulosis    Tubulovillous adenoma       Objective:    wts  01/03/2024      160 09/06/2023       168    07/16/23 159 lb (72.1 kg)  12/17/22 152 lb (68.9 kg)  10/04/19 199 lb 3.2 oz (90.4 kg)     Vital signs reviewed  01/03/2024  - Note at rest 02 sats  92% on RA   General appearance:    chronically ill appearing amb wm nad     HEENT : Oropharynx  clear/ edentulous   Nasal turbinates nl    NECK :  without  apparent JVD/ palpable Nodes/TM    LUNGS: no acc muscle use,  Min barrel  contour chest wall with bilateral distant  pan exp  wheeze and  without cough on insp or exp maneuvers and min  Hyperresonant  to  percussion bilaterally    CV:  RRR  no s3 or murmur or increase in P2, and no edema   ABD:  soft and nontender with pos end  insp Hoover's  in  the supine position.  No bruits or organomegaly appreciated   MS:  Nl gait/ ext warm without deformities Or obvious joint restrictions  calf tenderness, cyanosis or clubbing     SKIN:  warm and dry without lesions    NEURO:  alert, approp, nl sensorium with  no motor or cerebellar deficits apparent.              I personally reviewed images and agree with radiology impression as follows:   Chest CT 08/19/23   s contrast  Spiculated nodule in the inferior right upper lobe is essentially stable since prior study, 9 mm compared to 10 mm previously.  Clustered nodules in the right lower lobe with conglomerate nodular area measuring up to 1.9 cm. Scattered left lower lobe pulmonary nodules measuring up to 5 mm.    Assessment

## 2024-01-03 ENCOUNTER — Ambulatory Visit (INDEPENDENT_AMBULATORY_CARE_PROVIDER_SITE_OTHER): Payer: Medicaid Other | Admitting: Internal Medicine

## 2024-01-03 ENCOUNTER — Encounter: Payer: Self-pay | Admitting: Internal Medicine

## 2024-01-03 VITALS — BP 117/75 | HR 97 | Ht 69.0 in | Wt 160.0 lb

## 2024-01-03 DIAGNOSIS — J449 Chronic obstructive pulmonary disease, unspecified: Secondary | ICD-10-CM

## 2024-01-03 DIAGNOSIS — F17210 Nicotine dependence, cigarettes, uncomplicated: Secondary | ICD-10-CM | POA: Diagnosis not present

## 2024-01-03 MED ORDER — METHYLPREDNISOLONE ACETATE 80 MG/ML IJ SUSP
120.0000 mg | Freq: Once | INTRAMUSCULAR | Status: AC
  Administered 2024-01-03: 120 mg via INTRAMUSCULAR

## 2024-01-03 MED ORDER — BUDESONIDE-FORMOTEROL FUMARATE 160-4.5 MCG/ACT IN AERO: INHALATION_SPRAY | RESPIRATORY_TRACT | 12 refills | Status: DC

## 2024-01-03 NOTE — Patient Instructions (Addendum)
 Plan A = Automatic = Always=    Breztri (mustard) or Symbort 160 (ketchup)  Take 2 puffs first thing in am and then another 2 puffs about 12 hours later.    Work on inhaler technique:  relax and gently blow all the way out then take a nice smooth full deep breath back in, triggering the inhaler at same time you start breathing in.  Hold breath in for at least  5 seconds if you can. Blow out breztri / symbicort  thru nose. Rinse and gargle with water when done.  If mouth or throat bother you at all,  try brushing teeth/gums/tongue with arm and hammer toothpaste/ make a slurry and gargle and spit out.      Plan B = Backup (to supplement plan A, not to replace it) Only use your albuterol inhaler as a rescue medication to be used if you can't catch your breath by resting or doing a relaxed purse lip breathing pattern.  - The less you use it, the better it will work when you need it. - Ok to use the inhaler up to 2 puffs  every 4 hours if you must but call for appointment if use goes up over your usual need - Don't leave home without it !!  (think of it like the spare tire for your car)    Please remember to go to the lab department   for your tests - we will call you with the results when they are available.  The key is to stop smoking completely before smoking completely stops you!  Depomedrol 120 mg IM      Please schedule a follow up visit in 3 months but call sooner if needed

## 2024-01-03 NOTE — Assessment & Plan Note (Signed)
 4-5 min discussion re active cigarette smoking in addition to office E&M  Ask about tobacco use:   ongoing  Advise quitting   I took an extended  opportunity with this patient to outline the consequences of continued cigarette use  in airway disorders based on all the data we have from the multiple national lung health studies (perfomed over decades at millions of dollars in cost)  indicating that smoking cessation, not choice of inhalers or pulmonary physicians, is the most important aspect of his  care.   Assess willingness:  Not committed at this point Assist in quit attempt:  Per PCP when ready Arrange follow up:   Follow up per Primary Care planned   Low-dose CT lung cancer screening is recommended for patients who are 57-13 years of age with a 20+ pack-year history of smoking and who are currently smoking or quit <=15 years ago. No coughing up blood  No unintentional weight loss of > 15 pounds in the last 6 months - pt is eligible for scanning yearly until 51 y after he quits smoking  > due 02/2024

## 2024-01-04 NOTE — Assessment & Plan Note (Signed)
 Active smoker with slow progression and   copd by hx / exam  - 07/16/2023  After extensive coaching inhaler device,  effectiveness =  80% with dpi > try trelegy 100 sample  x 6 weeks and return to eval response > says no change on vs off but really not very active  - 09/06/2023   Walked on RA  x  3  lap(s) =  approx 450  ft  @ mod pace, stopped due to end of study  with lowest 02 sats 94% s sob  - PFT's  10/07/23  FEV1 1.35 (39 % ) ratio 0.45  p 26 % improvement from saba p 0 prior to study with DLCO  11.80 (44%)   and FV curve classically concave    - 01/03/2024  After extensive coaching inhaler device,  effectiveness = 80% > try symbicort 160 if can't get breztri thru insurance and he is actively wheezing today so added depomedrol 120 mg IM and check Eos/ alpha one status          Each maintenance medication was reviewed in detail including emphasizing most importantly the difference between maintenance and prns and under what circumstances the prns are to be triggered using an action plan format where appropriate.  Total time for H and P, chart review, counseling, reviewing hfa device(s) and generating customized AVS unique to this office visit / same day charting = 20 min

## 2024-01-06 LAB — CBC WITH DIFFERENTIAL/PLATELET
Basophils Absolute: 0.1 10*3/uL (ref 0.0–0.2)
Basos: 1 %
EOS (ABSOLUTE): 0.2 10*3/uL (ref 0.0–0.4)
Eos: 3 %
Hematocrit: 44.1 % (ref 37.5–51.0)
Hemoglobin: 14.5 g/dL (ref 13.0–17.7)
Immature Grans (Abs): 0 10*3/uL (ref 0.0–0.1)
Immature Granulocytes: 0 %
Lymphocytes Absolute: 2.4 10*3/uL (ref 0.7–3.1)
Lymphs: 27 %
MCH: 29.1 pg (ref 26.6–33.0)
MCHC: 32.9 g/dL (ref 31.5–35.7)
MCV: 89 fL (ref 79–97)
Monocytes Absolute: 0.8 10*3/uL (ref 0.1–0.9)
Monocytes: 9 %
Neutrophils Absolute: 5.3 10*3/uL (ref 1.4–7.0)
Neutrophils: 60 %
Platelets: 319 10*3/uL (ref 150–450)
RBC: 4.98 x10E6/uL (ref 4.14–5.80)
RDW: 14.4 % (ref 11.6–15.4)
WBC: 8.9 10*3/uL (ref 3.4–10.8)

## 2024-01-06 LAB — ALPHA-1-ANTITRYPSIN PHENOTYP: A-1 Antitrypsin: 205 mg/dL — ABNORMAL HIGH (ref 101–187)

## 2024-01-25 ENCOUNTER — Encounter: Payer: Self-pay | Admitting: Gastroenterology

## 2024-01-25 ENCOUNTER — Telehealth: Payer: Self-pay | Admitting: *Deleted

## 2024-01-25 ENCOUNTER — Ambulatory Visit (INDEPENDENT_AMBULATORY_CARE_PROVIDER_SITE_OTHER): Admitting: Gastroenterology

## 2024-01-25 VITALS — BP 121/75 | HR 103 | Temp 98.8°F | Ht 69.0 in | Wt 159.0 lb

## 2024-01-25 DIAGNOSIS — R1032 Left lower quadrant pain: Secondary | ICD-10-CM | POA: Diagnosis not present

## 2024-01-25 DIAGNOSIS — Z860100 Personal history of colon polyps, unspecified: Secondary | ICD-10-CM

## 2024-01-25 DIAGNOSIS — Z860101 Personal history of adenomatous and serrated colon polyps: Secondary | ICD-10-CM | POA: Insufficient documentation

## 2024-01-25 DIAGNOSIS — K5900 Constipation, unspecified: Secondary | ICD-10-CM | POA: Insufficient documentation

## 2024-01-25 DIAGNOSIS — Z8619 Personal history of other infectious and parasitic diseases: Secondary | ICD-10-CM | POA: Diagnosis not present

## 2024-01-25 DIAGNOSIS — K5903 Drug induced constipation: Secondary | ICD-10-CM

## 2024-01-25 MED ORDER — NALOXEGOL OXALATE 25 MG PO TABS: 25.0000 mg | ORAL_TABLET | Freq: Every day | ORAL | 5 refills | Status: DC

## 2024-01-25 NOTE — Telephone Encounter (Signed)
 [facility] for CT: Authorization Number: [id number] Case Number: [id number] Review Date: 01/25/2024 11:21:58 AM Expiration Date: 03/10/2024 Status: Your case has been Approved.

## 2024-01-25 NOTE — Patient Instructions (Signed)
 Start Movantik  25mg  daily for constipation.  CT scan of your abdomen/pelvis to be arranged for Friday.  Call if worsening abdominal pain, fever, blood in the stool.

## 2024-01-25 NOTE — Progress Notes (Signed)
 GI Office Note    Referring Provider: Ezell Hollow, NP Primary Care Physician:  Ezell Hollow, NP  Primary Gastroenterologist: Rheba Cedar, MD   Chief Complaint   Chief Complaint  Patient presents with   Abdominal Pain    LLQ pain     History of Present Illness   Joel Castillo is a 63 y.o. male presenting today at the request of Dennie Fitch, FNP for "nausea/vomiting, GERD". Patient states he is here for LLQ pain instead. Followed by Dr. Waymond Hailey for COPD and pulmonary nodules.  Today: LLQ since 2017. Had diverticulitis in 2016, noted on CT. States he was treated with antibiotics at that time and got better. Since then he has had intermittent LLQ pain. Can go a few days at a time without LLQ pain but has it fairly consistently. Also with constipation in setting of opioid use. Used to be given something by his old pain management doctor but when he had to transfer care (old office closed down), he was not given anything to manage his constipation. He can go a week at a time without a stool. He feels urge to have a BM but passes only gas. Has to force out stool. No melena, brbpr. He does have some intermittent GERD. States it will usually wake him up at night. Will drink water /milk and take TUMS with good relief. Denies n/v, dysphagia. Does not feel he needs prescription medication to control his GERD.   States he has lung nodules being followed by Dr. Waymond Hailey. Found incidentally when he had CT shoulder. Until it is determined whether this is cancer, his shoulder surgery is on hold. Had follow up CT chest next month.    Patient has history of advanced colon polyp in sigmoid region during 2013 colonoscopy. Prep was poor. He was advised 3 month follow up colonoscopy but he never responded when we tried to get in touch with him to schedule.  EGD June 2013: Patulous EG junction.  Moderate sized hiatal hernia.  Abnormal gastric mucosa status post biopsy.  Duodenal erosions.  H. pylori  gastritis on biopsy.  Treated with Prevpac  Colonoscopy June 2013: Multiple rectal colonic polyps status post removal.  Sigmoid diverticulosis.  Poor preparation. 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  Recommended 25-month follow-up colonoscopy.   Medications   Current Outpatient Medications  Medication Sig Dispense Refill   aspirin  EC 81 MG tablet Take 1 tablet (81 mg total) by mouth daily. 30 tablet 12   atorvastatin  (LIPITOR) 40 MG tablet Take 40 mg by mouth daily with lunch.     budesonide -formoterol  (SYMBICORT ) 160-4.5 MCG/ACT inhaler Take 2 puffs first thing in am and then another 2 puffs about 12 hours later. 1 each 12   cyclobenzaprine (FLEXERIL) 10 MG tablet Take 10 mg by mouth 2 (two) times daily as needed for muscle spasms.     mirtazapine  (REMERON ) 15 MG tablet Take 15 mg by mouth at bedtime.     oxyCODONE -acetaminophen  (PERCOCET) 10-325 MG tablet Take 1 tablet by mouth 4 (four) times daily as needed for pain.     pregabalin  (LYRICA ) 200 MG capsule Take 200 mg by mouth every 8 (eight) hours.     No current facility-administered medications for this visit.    Allergies   Allergies as of 01/25/2024   (No Known Allergies)    Past Medical History   Past Medical History:  Diagnosis Date  Chronic abdominal pain    Chronic back pain    Chronic bronchitis    Chronic nausea    Complication of anesthesia 11/22/1998   sat O2 dropped during surgery   COPD (chronic obstructive pulmonary disease) (HCC)    Depression    GERD (gastroesophageal reflux disease)    H. pylori infection 03/05/2012   treated with prevpac   Hyperplastic colon polyp    Hypertension    Lumbago    Myocardial infarction [facility])    2018   Rectal bleeding    Shortness of breath    Sigmoid diverticulosis    Tubulovillous adenoma     Past Surgical History   Past Surgical History:  Procedure Laterality Date   ANTERIOR  CERVICAL DECOMP/DISCECTOMY FUSION N/A 12/17/2022   Procedure: ANTERIOR CERVICAL DISCECTOMY AND FUSION CERVICAL FIVE-SIX/CERVICAL SIX-SEVEN;  Surgeon: Augusto Blonder, MD;  Location: [facility] OR;  Service: Neurosurgery;  Laterality: N/A;   BACK SURGERY  11/22/1998   COLONOSCOPY  03/24/12   Dr. Riley Cheadle- multiple rectal and colonic polyps= tubulovillous adenoma, tular adenoma and hyperplastic polyps ,  sigmoid diverticulosis, poor prep   ESOPHAGOGASTRODUODENOSCOPY  03/24/12   Dr. Riley Cheadle- patulous EG junction, moderate hiatal hernia, abnormal gastric mucosa, duodenal erosions, +hpylori   FINGER SURGERY      Past Family History   Family History  Problem Relation Age of Onset   Breast cancer Mother        deceased   Colon cancer Father        age? died in prison from colon cancer   Colon cancer Maternal Grandmother    Breast cancer Paternal Grandmother     Past Social History   Social History   Socioeconomic History   Marital status: Single    Spouse name: Not on file   Number of children: 0   Years of education: Not on file   Highest education level: Not on file  Occupational History   Occupation: unemployed  Tobacco Use   Smoking status: Every Day    Current packs/day: 0.50    Average packs/day: 0.5 packs/day for 25.0 years (12.5 ttl pk-yrs)    Types: Cigarettes   Smokeless tobacco: Never  Vaping Use   Vaping status: Never Used  Substance and Sexual Activity   Alcohol use: No   Drug use: No   Sexual activity: Not Currently  Other Topics Concern   Not on file  Social History Narrative   Grew up in [place].   Multiple siblings but relationship with only one sister.   Social Drivers of Corporate investment banker Strain: Not on file  Food Insecurity: No Food Insecurity (07/28/2022)   Received from [facility], [facility]   Hunger Vital Sign    Worried About Running Out of Food in the Last Year: Never true    Ran Out of Food in the Last Year: Never true   Transportation Needs: Not on file  Physical Activity: Not on file  Stress: Not on file  Social Connections: Unknown (07/17/2022)   Received from [facility], [facility]   Social Network    Social Network: Not on file  Intimate Partner Violence: Unknown (07/17/2022)   Received from [employer], [facility]   HITS    Physically Hurt: Not on file    Insult or Talk Down To: Not on file    Threaten Physical Harm: Not on file    Scream or Curse: Not on file    Review of Systems  General: Negative for anorexia, weight loss, fever, chills, fatigue, weakness. Eyes: Negative for vision changes.  ENT: Negative for hoarseness, difficulty swallowing , nasal congestion. CV: Negative for chest pain, angina, palpitations, dyspnea on exertion, peripheral edema.  Respiratory: Negative for dyspnea at rest, dyspnea on exertion, +cough, sputum, wheezing.  GI: See history of present illness. GU:  Negative for dysuria, hematuria, urinary incontinence, urinary frequency, nocturnal urination.  MS: +shoulder pain, low back pain.  Derm: Negative for rash or itching.  Neuro: Negative for weakness, abnormal sensation, seizure, frequent headaches, memory loss,  confusion.  Psych: Negative for anxiety, depression, suicidal ideation, hallucinations.  Endo: Negative for unusual weight change.  Heme: Negative for bruising or bleeding. Allergy: Negative for rash or hives.  Physical Exam   BP 121/75 (BP Location: Right Arm, Patient Position: Sitting, Cuff Size: Normal)   Pulse (!) 103   Temp 98.8 F (37.1 C) (Oral)   Ht 5\' 9"  (1.753 m)   Wt 159 lb (72.1 kg)   SpO2 95%   BMI 23.48 kg/m    General: Well-nourished, well-developed in no acute distress.  Head: Normocephalic, atraumatic.   Eyes: Conjunctiva pink, no icterus. Mouth: Oropharyngeal mucosa moist and pink   Neck: Supple without thyromegaly, masses, or lymphadenopathy.  Lungs: Clear to auscultation bilaterally.  Heart: Regular rate  and rhythm, no murmurs rubs or gallops.  Abdomen: Bowel sounds are normal,  nondistended, no hepatosplenomegaly or masses,  no abdominal bruits or hernia, no rebound or guarding.  Rectus diastasis. Tenderness noted throughout abd but increased in LLQ Rectal: not performed Extremities: No lower extremity edema. No clubbing or deformities.  Neuro: Alert and oriented x 4 , grossly normal neurologically.  Skin: Warm and dry, no rash or jaundice.   Psych: Alert and cooperative, normal mood and affect.  Labs   Lab Results  Component Value Date   NA 137 12/17/2022   CL 106 12/17/2022   K 4.1 12/17/2022   CO2 23 12/17/2022   BUN 12 12/17/2022   CREATININE 1.27 (H) 12/17/2022   GFRNONAA >60 12/17/2022   CALCIUM  9.0 12/17/2022   ALBUMIN 3.9 04/21/2015   GLUCOSE 98 12/17/2022   Lab Results  Component Value Date   WBC 8.9 01/03/2024   HGB 14.5 01/03/2024   HCT 44.1 01/03/2024   MCV 89 01/03/2024   PLT 319 01/03/2024    Imaging Studies   No results found.  Assessment/Plan:   LLQ pain: -chronic intermittent LLQ pain with history of remote CT documented diverticulitis.  -noted advanced colon polyp in 2013, poor bowel prep, advised at that time need for colonoscopy in 3 months but this was not completed, concerned for possibility of colon cancer -constipation may be contributing -CT A/P with contrast initially with plans to move towards colonoscopy in near future  Constipation: -likely opioid-induced -cannot rule out obstructing process -start Movantik  25mg  daily  H/O H.pylori: -consider checking for eradication in near future   [place] S. Harles Lied, MHS, PA-C [facility]

## 2024-01-28 ENCOUNTER — Ambulatory Visit (HOSPITAL_COMMUNITY)
Admission: RE | Admit: 2024-01-28 | Discharge: 2024-01-28 | Disposition: A | Discharge status: Home / Self Care | Source: Ambulatory Visit | Attending: Gastroenterology | Admitting: Gastroenterology

## 2024-01-28 DIAGNOSIS — R1032 Left lower quadrant pain: Secondary | ICD-10-CM | POA: Insufficient documentation

## 2024-01-28 MED ORDER — IOHEXOL 300 MG/ML  SOLN
100.0000 mL | Freq: Once | INTRAMUSCULAR | Status: AC | PRN
  Administered 2024-01-28: 100 mL via INTRAVENOUS

## 2024-02-03 ENCOUNTER — Telehealth: Payer: Self-pay | Admitting: Internal Medicine

## 2024-02-03 NOTE — Telephone Encounter (Signed)
 LVM for patient to call and discuss scheduling the CT chest ordered by Dr. Waymond Hailey

## 2024-02-04 NOTE — Telephone Encounter (Signed)
 Spoke with patient to discuss scheduling the CT chest prdered by Dr. Macario Savin asked I call back next week---he is sick

## 2024-02-14 ENCOUNTER — Other Ambulatory Visit: Payer: Self-pay

## 2024-02-14 DIAGNOSIS — A048 Other specified bacterial intestinal infections: Secondary | ICD-10-CM

## 2024-02-15 NOTE — Telephone Encounter (Signed)
 Spoke with patient regarding the Wednesday 03/08/24 8:30 am CT chest wo contrast schedule at [facility] time is 8:15 am---1st floor registration desk---Patient voiced his understanding

## 2024-02-21 ENCOUNTER — Ambulatory Visit: Payer: Self-pay | Admitting: Gastroenterology

## 2024-03-08 ENCOUNTER — Ambulatory Visit (HOSPITAL_COMMUNITY)
Admission: RE | Admit: 2024-03-08 | Discharge: 2024-03-08 | Disposition: A | Discharge status: Home / Self Care | Source: Ambulatory Visit | Attending: Internal Medicine | Admitting: Internal Medicine

## 2024-03-08 DIAGNOSIS — R911 Solitary pulmonary nodule: Secondary | ICD-10-CM | POA: Insufficient documentation

## 2024-03-13 ENCOUNTER — Ambulatory Visit: Payer: Self-pay | Admitting: Internal Medicine

## 2024-03-13 NOTE — Telephone Encounter (Addendum)
 Pt declines UC/ED, same day, states he only wants to see Dr. Waymond Hailey.   FYI Only or Action Required?: Action required by provider  Patient is followed in Pulmonology for 01/03/24, last seen on 01/03/2024 by Joel Formica, MD. Called Nurse Triage reporting Shortness of Breath. Symptoms began several weeks ago. Interventions attempted: Other: Unknown. Symptoms are: gradually worsening.  Triage Disposition: See HCP Within 4 Hours (Or PCP Triage)  Patient/caregiver understands and will follow disposition?: No, wishes to speak with PCP                     Copied from CRM [id number]. Topic: Clinical - Red Word Triage >> Mar 13, 2024  9:10 AM Joel Castillo wrote: Red Word that prompted transfer to Nurse Triage: Patient [phone] is trying to schedule a f/u appointment with Dr. Waymond Hailey, had CT scan done last week. Patient states is not breathing really good, it has worsen, shortness of breath, wheezing, no dizziness nor pain. Please advise. Reason for Disposition  [1] Longstanding difficulty breathing (e.g., CHF, COPD, emphysema) AND [2] WORSE than normal  Answer Assessment - Initial Assessment Questions 1. RESPIRATORY STATUS: "Describe your breathing?" (e.g., wheezing, shortness of breath, unable to speak, severe coughing)      Shortness of Breath 2. ONSET: "When did this breathing problem begin?"      X 2 weeks 3. PATTERN "Does the difficult breathing come and go, or has it been constant since it started?"      Intermittent 4. SEVERITY: "How bad is your breathing?" (e.g., mild, moderate, severe)    - MILD: No SOB at rest, mild SOB with walking, speaks normally in sentences, can lie down, no retractions, pulse < 100.    - MODERATE: SOB at rest, SOB with minimal exertion and prefers to sit, cannot lie down flat, speaks in phrases, mild retractions, audible wheezing, pulse 100-120.    - SEVERE: Very SOB at rest, speaks in single words, struggling to breathe, sitting hunched forward,  retractions, pulse > 120      Mild-Moderate  7. LUNG HISTORY: "Do you have any history of lung disease?"  (e.g., pulmonary embolus, asthma, emphysema)     COPD 8. CAUSE: "What do you think is causing the breathing problem?"      Unknown 9. OTHER SYMPTOMS: "Do you have any other symptoms? (e.g., dizziness, runny nose, cough, chest pain, fever)     Cough, wheezing 10. O2 SATURATION MONITOR:  "Do you use an oxygen saturation monitor (pulse oximeter) at home?" If Yes, ask: "What is your reading (oxygen level) today?" "What is your usual oxygen saturation reading?" (e.g., 95%)       Pt does not check at home  Protocols used: Breathing Difficulty-A-AH

## 2024-03-13 NOTE — Telephone Encounter (Signed)
 I called and spoke with the pt and scheduled for [place] office at 9:45 with Dr Waymond Hailey on 03/16/24. Pt notified of location.

## 2024-03-15 ENCOUNTER — Ambulatory Visit (INDEPENDENT_AMBULATORY_CARE_PROVIDER_SITE_OTHER): Admitting: Gastroenterology

## 2024-03-15 ENCOUNTER — Ambulatory Visit: Payer: Self-pay | Admitting: Internal Medicine

## 2024-03-15 ENCOUNTER — Encounter: Payer: Self-pay | Admitting: Gastroenterology

## 2024-03-15 VITALS — BP 129/82 | HR 105 | Temp 98.1°F | Ht 69.0 in | Wt 160.6 lb

## 2024-03-15 DIAGNOSIS — K5900 Constipation, unspecified: Secondary | ICD-10-CM

## 2024-03-15 DIAGNOSIS — Z860101 Personal history of adenomatous and serrated colon polyps: Secondary | ICD-10-CM

## 2024-03-15 DIAGNOSIS — Z8619 Personal history of other infectious and parasitic diseases: Secondary | ICD-10-CM

## 2024-03-15 DIAGNOSIS — R1032 Left lower quadrant pain: Secondary | ICD-10-CM | POA: Diagnosis not present

## 2024-03-15 DIAGNOSIS — K5903 Drug induced constipation: Secondary | ICD-10-CM

## 2024-03-15 MED ORDER — NALOXEGOL OXALATE 25 MG PO TABS: 25.0000 mg | ORAL_TABLET | Freq: Every day | ORAL | 5 refills | Status: AC

## 2024-03-15 NOTE — Progress Notes (Signed)
 GI Office Note    Referring Provider: Ezell Hollow, NP Primary Care Physician:  Ezell Hollow, NP  Primary Gastroenterologist: Rheba Cedar, MD   Chief Complaint   Chief Complaint  Patient presents with   Follow-up    Having issues with diarrhea    History of Present Illness   Joel Castillo is a 63 y.o. male presenting today for follow up. Last seen in 01/2024 for LLQ pain.   From last OV: LLQ since 2017. Had diverticulitis in 2016, noted on CT. States he was treated with antibiotics at that time and got better. Since then he has had intermittent LLQ pain. Can go a few days at a time without LLQ pain but has it fairly consistently. Also with constipation in setting of opioid use. Used to be given something by his old pain management doctor but when he had to transfer care (old office closed down), he was not given anything to manage his constipation. He can go a week at a time without a stool. He feels urge to have a BM but passes only gas. Also with GERD controlled with TUMS. H/o advanced colon polyp during 2013 procedure, prep poor. Advised 3 month follow up but he never completed.   Today: He completed CT back in 01/2024. Noted to have large amt of stool throughout the colon. When we called him with results he reported diarrhea. He was never able to get movantik  for opioid induced constipation. We had concerned for overflow diarrhea and requested ov with him. He had been unable to get movantik .  He had declined colonoscopy.   Diarrhea lasted 3-4 days. 2-3 episodes a day. Some solid waste mixed in. Really no diarrhea in few weeks. Feels like he needs to have BM but won't come out, just passes gas. Last night passed hard stool, had to force it out.  No n/v. No melena, brbpr. Has been several weeks since he passed significant amount of stool. Continues to have chronic intermittent LLQ. Recent CT with no bowel wall thickening, strictures, or active diverticulitis.   Patient  continues to decline colonoscopy. He is aware that we cannot exclude polyps or colon cancer without updating colonoscopy. We again discussed h.pylori stool testing to confirm eradication given it can be associated with developing gatric malignancy, gastric ulcer. He may consider.    EGD June 2013: Patulous EG junction.  Moderate sized hiatal hernia.  Abnormal gastric mucosa status post biopsy.  Duodenal erosions.  H. pylori gastritis on biopsy.  Treated with Prevpac   Colonoscopy June 2013: Multiple rectal colonic polyps status post removal.  Sigmoid diverticulosis.  Poor preparation. 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  Recommended 34-month follow-up colonoscopy.   Medications   Current Outpatient Medications  Medication Sig Dispense Refill   aspirin  EC 81 MG tablet Take 1 tablet (81 mg total) by mouth daily. 30 tablet 12   atorvastatin  (LIPITOR) 40 MG tablet Take 40 mg by mouth daily with lunch.     budesonide -formoterol  (SYMBICORT ) 160-4.5 MCG/ACT inhaler Take 2 puffs first thing in am and then another 2 puffs about 12 hours later. 1 each 12   cyclobenzaprine (FLEXERIL) 10 MG tablet Take 10 mg by mouth 2 (two) times daily as needed for muscle spasms.     folic acid (FOLVITE) 1 MG tablet Take 1 mg by mouth daily.     mirtazapine  (REMERON ) 15 MG tablet Take 15 mg by  mouth at bedtime.     oxyCODONE -acetaminophen  (PERCOCET) 10-325 MG tablet Take 1 tablet by mouth 4 (four) times daily as needed for pain.     pregabalin  (LYRICA ) 200 MG capsule Take 200 mg by mouth every 8 (eight) hours.     Vitamin D, Ergocalciferol, (DRISDOL) 1.25 MG (50000 UNIT) CAPS capsule Take 50,000 Units by mouth once a week.     No current facility-administered medications for this visit.    Allergies   Allergies as of 03/15/2024   (No Known Allergies)       Review of Systems   General: Negative for anorexia, weight loss, fever,  chills, fatigue, weakness. ENT: Negative for hoarseness, difficulty swallowing , nasal congestion. CV: Negative for chest pain, angina, palpitations, +dyspnea on exertion, peripheral edema.  Respiratory: Negative for dyspnea at rest, +dyspnea on exertion, cough, sputum, wheezing.  GI: See history of present illness. GU:  Negative for dysuria, hematuria, urinary incontinence, urinary frequency, nocturnal urination.  Endo: Negative for unusual weight change.     Physical Exam   BP 129/82 (BP Location: Right Arm, Patient Position: Sitting, Cuff Size: Normal)   Pulse (!) 105   Temp 98.1 F (36.7 C) (Oral)   Ht 5' 9 (1.753 m)   Wt 160 lb 9.6 oz (72.8 kg)   SpO2 91%   BMI 23.72 kg/m    General: Well-nourished, well-developed in no acute distress.  Eyes: No icterus. Mouth: Oropharyngeal mucosa moist and pink   Lungs: Clear to auscultation bilaterally.  Heart: Regular rate and rhythm, no murmurs rubs or gallops.  Abdomen: Bowel sounds are normal, nondistended, no hepatosplenomegaly or masses,  no abdominal bruits or hernia , no rebound or guarding. Mild lower abdominal tenderness Rectal: not performed Extremities: No lower extremity edema. No clubbing or deformities. Neuro: Alert and oriented x 4   Skin: Warm and dry, no jaundice.   Psych: Alert and cooperative, normal mood and affect.  Labs   Lab Results  Component Value Date   WBC 8.9 01/03/2024   HGB 14.5 01/03/2024   HCT 44.1 01/03/2024   MCV 89 01/03/2024   PLT 319 01/03/2024    Imaging Studies   CT Chest Wo Contrast Result Date: 03/14/2024 CLINICAL DATA:  Follow-up pulmonary nodules EXAM: CT CHEST WITHOUT CONTRAST TECHNIQUE: Multidetector CT imaging of the chest was performed following the standard protocol without IV contrast. RADIATION DOSE REDUCTION: This exam was performed according to the departmental dose-optimization program which includes automated exposure control, adjustment of the mA and/or kV according to  patient size and/or use of iterative reconstruction technique. COMPARISON:  08/19/2023 FINDINGS: Cardiovascular: Somewhat limited due to lack of IV contrast. Atherosclerotic calcifications of the aorta are noted. No aneurysmal dilatation is seen. No cardiac enlargement is noted Mediastinum/Nodes: Thoracic inlet is within normal limits. No hilar or mediastinal adenopathy is seen. Lungs/Pleura: Lungs are well aerated bilaterally. No sizable effusion is seen. Scattered parenchymal nodules are noted. Spiculated nodule in the right upper lobe on image number 68 of series 4 is again seen and stable. A more elongated spiculated lesion is seen in the right lower lobe best seen on image number 102 of series 4. This measures approximately 2.2 cm and roughly stable from the prior exam. Associated smaller nodules are seen. Diffuse emphysematous changes are noted. No focal infiltrate is seen. Upper Abdomen: No acute abnormality. Musculoskeletal: No chest wall mass or suspicious bone lesions identified. IMPRESSION: Stable pulmonary nodules when compared with the prior exam. Follow-up exam in 12-18 months from  today's scan is considered optional for low risk patients but is recommended for high-risk patients. No new focal abnormality is noted. Aortic Atherosclerosis (ICD10-I70.0) and Emphysema (ICD10-J43.9). Electronically Signed   By: Violeta Grey M.D.   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 Cannot rule out underlying malignancy but at least no obvious mass or stricturing on CT. He states pharmacy never received Movantik  RX back in April, although it was sent and we received a receipt of pharmacy receiving it electronically.  -start Movantik  25mg  daily. RX sent. Checked with pharmacy, his cost will be $4. -samples of Linzess provided today, to get his stools moving until he is able to get his Movantik .  -he is to call if constipation not controlled. -return to office in 3  months, unless he is adequately controlled then he can cancel.  H/O H.pylori: ordered H.pylori stool antigen at 01/2024, patient may complete. We discussed reasoning in confirming eradication given it can cause ulcers, gastric malignancy.  H/O advanced adenomatous colon polyps: was due for 3 month follow up colonoscopy in 2013. Patient continues to decline colonoscopy. Discussed that he could be harboring a precancerous colon polyps or even malignancy. He will let me know if he changes his mind.   LLQ pain: stable, intermittent LLQ pain. Recent CT reassuring. Constipation may be contributing.  -manage constipation with Movantik  -call if worsening symptoms.        Trudie Fuse. Harles Lied, MHS, PA-C [facility]

## 2024-03-15 NOTE — Patient Instructions (Signed)
 Samples of Linzess 290mcg(take one daily) provided to help you start moving your bowels until you are able to pick up Movantik  from the pharmacy. Once you have your prescription filled, please stop Linzess.  Please call my CMA Tammy at [phone] if the medication is not working or you have not been able to pick it up due to insurance or cost. Monitor your left lower abdominal pain, if worsening pain, please let me know. You can collect stool for H.pylori testing as discussed today. The order is under [facility], you can go to any site and pick up container for stool collection. H.pylori if persistent, can cause gastritis, ulcers, or in rare cases malignancy. You are well overdue for a colonoscopy due to your history of advanced precancerous polyps in 2013. If you decide you want to pursue colonoscopy, please reach out.  We will see you back in 3 months, if you are doing well at that time, you can cancel appointment.

## 2024-03-16 ENCOUNTER — Encounter: Payer: Self-pay | Admitting: Internal Medicine

## 2024-03-16 ENCOUNTER — Ambulatory Visit (INDEPENDENT_AMBULATORY_CARE_PROVIDER_SITE_OTHER): Admitting: Internal Medicine

## 2024-03-16 VITALS — BP 124/78 | HR 100 | Temp 98.6°F | Ht 69.0 in | Wt 160.9 lb

## 2024-03-16 DIAGNOSIS — F17210 Nicotine dependence, cigarettes, uncomplicated: Secondary | ICD-10-CM | POA: Diagnosis not present

## 2024-03-16 DIAGNOSIS — J449 Chronic obstructive pulmonary disease, unspecified: Secondary | ICD-10-CM | POA: Diagnosis not present

## 2024-03-16 DIAGNOSIS — R911 Solitary pulmonary nodule: Secondary | ICD-10-CM | POA: Diagnosis not present

## 2024-03-16 MED ORDER — BUDESONIDE-FORMOTEROL FUMARATE 160-4.5 MCG/ACT IN AERO: INHALATION_SPRAY | RESPIRATORY_TRACT | 12 refills | Status: AC

## 2024-03-16 MED ORDER — PREDNISONE 10 MG PO TABS: ORAL_TABLET | ORAL | 0 refills | Status: DC

## 2024-03-16 MED ORDER — ALBUTEROL SULFATE HFA 108 (90 BASE) MCG/ACT IN AERS: INHALATION_SPRAY | RESPIRATORY_TRACT | 11 refills | Status: AC

## 2024-03-16 NOTE — Progress Notes (Signed)
 Joel Castillo, male    DOB: 1961-02-24    MRN: [id number]   Brief patient profile:  27  yowm  active smoker/MM  referred to pulmonary clinic in [place]  07/16/2023 by Sherman Divers NP  for progressive doe x 2014 ? COPD and SPN incidentally discovered on Shoulder CT 05/18/23 x 1 cm spiculated  with GOLD 3 criteria for COPD established 10/2023      History of Present Illness  07/16/2023  Pulmonary/ 1st Castillo eval/ Joel Castillo / Joel Castillo  Chief Complaint  Patient presents with   Consult  Dyspnea:  food lion x sev aisles / gets let off at door  Cough: worse in am/ light green x 7 months prior to OV  - never heme Sleep: flat bed with 3 pillows  SABA use: none lately  02: none  Rec Suggested e-cigs as an optional  one way bridge  Off all tobacco products  Start Trelegy 100 is one click first thing each am  Please schedule a follow up Castillo visit in 6 weeks, call sooner if needed with all medications /inhalers/ solutions in hand    Late add: needs pfts next available > not done as of 09/06/2023    09/06/2023  f/u ov/Joel Castillo/Joel Castillo re: MPN maint on no rx  did not bring meds Chief Complaint  Patient presents with   Lung Nodule   Dyspnea:  no change food lion x one or two aisles then stops due to sob/ not sure whether trelegy or saba helps or not  Cough: 1st thing am mucoid  Sleeping: bed is flat with several pillows n   resp cc  SABA use: none  02: none  Rec The key is to stop smoking completely before smoking completely stops you! Be sure to go for your lung function tests Oct 07 2023 and I will call results   if your breathing or cough bother you but you must bring all your medications with you including over the counters   01/03/2024  f/u ov/[place]/Joel Castillo re:   Gold 3 COPD  maint on nothing did   bring meds - no resp rx  Chief Complaint  Patient presents with   Shortness of Breath  Dyspnea:  stili able to do food lion slow pace  Cough: worse in am > slt   green tint  Sleeping: flat bed / 2 pillows  s resp cc while asleep SABA use: ;none  02: none  Lung cancer screening: due for CT 02/2024  Rec Plan A = Automatic = Always=    Breztri (mustard) or Symbort 160 (ketchup)  Take 2 puffs first thing in am and then another 2 puffs about 12 hours later.  Work on inhaler technique:    Plan B = Backup (to supplement plan A, not to replace it) Only use your albuterol  inhaler as a rescue medication Please remember to go to the lab department   for your tests - we will call you with the results when they are available. The key is to stop smoking completely before smoking completely stops you! 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   CT w/o contrast 03/08/24 > no change, returned to [facility] as he is active smoker   03/16/2024  f/u ov/Joel Castillo re: COPD GOLD 3/ SPN   maint on symbicort  160 but not consistent with it/insurance won't cover breztri/   still smoking  Chief Complaint  Patient presents with   Follow-up    COPD and CT f/u    Dyspnea:  more doe but still able to do food lion slow 2-3 aisles  Cough: white thick2 Sleeping: flat bed 2 pillows some wheezing  resp cc  SABA use: does not have any  02: none   Lung cancer screening :  in program     No obvious day to day or daytime variability or assoc excess/ purulent sputum or mucus plugs or hemoptysis or cp or chest tightness, subjective wheeze or overt sinus or hb symptoms.    Also denies any obvious fluctuation of symptoms with weather or environmental changes or other aggravating or alleviating factors except as outlined above   No unusual exposure hx or h/o childhood pna/ asthma or knowledge of premature birth.  Current Allergies, Complete Past Medical History, Past Surgical History, Family History, and Social History were reviewed in [employer] record.  ROS  The following are not active complaints unless  bolded Hoarseness, sore throat, dysphagia, dental problems, itching, sneezing,  nasal congestion or discharge of excess mucus or purulent secretions, ear ache,   fever, chills, sweats, unintended wt loss or wt gain, classically pleuritic or exertional cp,  orthopnea pnd or arm/hand swelling  or leg swelling, presyncope, palpitations, abdominal pain, anorexia, nausea, vomiting, diarrhea  or change in bowel habits or change in bladder habits, change in stools or change in urine, dysuria, hematuria,  rash, arthralgias, visual complaints, headache, numbness, weakness or ataxia or problems with walking or coordination,  change in mood or  memory.        Current Meds  Medication Sig   aspirin  EC 81 MG tablet Take 1 tablet (81 mg total) by mouth daily.   atorvastatin  (LIPITOR) 40 MG tablet Take 40 mg by mouth daily with lunch.   budesonide -formoterol  (SYMBICORT ) 160-4.5 MCG/ACT inhaler Take 2 puffs first thing in am and then another 2 puffs about 12 hours later.   cyclobenzaprine (FLEXERIL) 10 MG tablet Take 10 mg by mouth 2 (two) times daily as needed for muscle spasms.   folic acid (FOLVITE) 1 MG tablet Take 1 mg by mouth daily.   mirtazapine  (REMERON ) 15 MG tablet Take 15 mg by mouth at bedtime.   naloxegol  oxalate (MOVANTIK ) 25 MG TABS tablet Take 1 tablet (25 mg total) by mouth daily. Take 1 hour before or 2 hours after a meal.   oxyCODONE -acetaminophen  (PERCOCET) 10-325 MG tablet Take 1 tablet by mouth 4 (four) times daily as needed for pain.   pregabalin  (LYRICA ) 200 MG capsule Take 200 mg by mouth every 8 (eight) hours.   Vitamin D, Ergocalciferol, (DRISDOL) 1.25 MG (50000 UNIT) CAPS capsule Take 50,000 Units by mouth once a week.          Past Medical History:  Diagnosis Date   Chronic abdominal pain    Chronic back pain    Chronic bronchitis    Chronic nausea    Complication of anesthesia 11/22/1998   sat O2 dropped during surgery   COPD (chronic obstructive pulmonary disease) (HCC)     Depression    GERD (gastroesophageal reflux disease)    H. pylori infection 03/05/2012   treated with prevpac   Hyperplastic colon polyp    Hypertension    Lumbago    Myocardial infarction (HCC)    2018   Rectal bleeding    Shortness of breath    Sigmoid diverticulosis    Tubulovillous adenoma  Objective:    wts  03/16/2024       160  01/03/2024       160 09/06/2023       168    07/16/23 159 lb (72.1 kg)  12/17/22 152 lb (68.9 kg)  10/04/19 199 lb 3.2 oz (90.4 kg)    Vital signs reviewed  03/16/2024  - Note at rest 02 sats  90% on RA   General appearance:    amb wm nad    HEENT : Oropharynx  clear/edentulous    Nasal turbinates  nl    NECK :  without  apparent JVD/ palpable Nodes/TM    LUNGS: no acc muscle use,  Min barrel  contour chest wall with bilateral  slightly decreased bs s audible wheeze and  without cough on insp or exp maneuvers and min  Hyperresonant  to  percussion bilaterally    CV:  RRR  no s3 or murmur or increase in P2, and no edema   ABD:  soft and nontender with pos end  insp Hoover's  in the supine position.  No bruits or organomegaly appreciated   MS:  Nl gait/ ext warm without deformities Or obvious joint restrictions  calf tenderness, cyanosis or clubbing     SKIN: warm and dry without lesions    NEURO:  alert, approp, nl sensorium with  no motor or cerebellar deficits apparent.               Assessment

## 2024-03-16 NOTE — Patient Instructions (Addendum)
 Plan A = Automatic = Always=    Symbicort  160 Take 2 puffs first thing in am and then another 2 puffs about 12 hours later.     Plan B = Backup (to supplement plan A, not to replace it) Only use your albuterol  inhaler as a rescue medication to be used if you can't catch your breath by resting or doing a relaxed purse lip breathing pattern.  - The less you use it, the better it will work when you need it. - Ok to use the inhaler up to 2 puffs  every 4 hours if you must but call for appointment if use goes up over your usual need - Don't leave home without it !!  (think of it like the spare tire for your car)    Work on inhaler technique:  relax and gently blow all the way out then take a nice smooth full deep breath back in, triggering the inhaler at same time you start breathing in.  Hold breath in for at least  5 seconds if you can. Blow out symbicort   thru nose. Rinse and gargle with water  when done.  If mouth or throat bother you at all,  try brushing teeth/gums/tongue with arm and hammer toothpaste/ make a slurry and gargle and spit out.   Prednisone 10 mg take  4 each am x 2 days,   2 each am x 2 days,  1 each am x 2 days and stop    The key is to stop smoking completely before smoking completely stops you!      Please schedule a follow up office visit in 6 weeks ([facility] office) call sooner if needed with all medications /inhalers/ solutions in hand so we can verify exactly what you are taking. This includes all medications from all doctors and over the counters

## 2024-03-16 NOTE — Progress Notes (Signed)
 Was speaking with pt and he mentioned he was going to have to call me back, his phone was acting up

## 2024-03-17 NOTE — Progress Notes (Signed)
 Spoke with patient regarding results and recommendations. Patient voiced understanding. No further questions or concerns. Denies [facility] referral

## 2024-03-18 NOTE — Assessment & Plan Note (Signed)
 Active smoker/MM   - 07/16/2023  After extensive coaching inhaler device,  effectiveness =  80% with dpi > try trelegy 100 sample  x 6 weeks and return to eval response > says no change on vs off but really not very active  - 09/06/2023   Walked on RA  x  3  lap(s) =  approx 450  ft  @ mod pace, stopped due to end of study  with lowest 02 sats 94% s sob  - PFT's  10/07/23  FEV1 1.35 (39 % ) ratio 0.45  p 26 % improvement from saba p 0 prior to study with DLCO  11.80 (44%)   and FV curve classically concave   - 01/03/2024  After extensive coaching inhaler device,  effectiveness = 80% > try symbicort  160 if can't get breztri thru insurance  - Allergy screen 01/03/2024 >  Eos 0.2  And 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

## 2024-03-18 NOTE — Assessment & Plan Note (Signed)
 Active smoker  - Shoulder CT 05/18/23 x 1 cm spiculated  > rec f/u ct 08/19/23 whole lung  - CT chest   10/18/22  Spiculated nodule in the inferior right upper lobe is essentially stable since prior study, 9 mm compared to 10 mm previously. Clustered nodules in the right lower lobe with conglomerate nodular area measuring up to 1.9 cm. Scattered left lower lobe pulmonary nodules measuring up to 5 mm CT w/o contrast 03/08/24 > no change, returned to [facility] as he is active smoker   Referred back to [facility]  Discussed in detail all the  indications, usual  risks and alternatives  relative to the benefits with patient who agrees to proceed with w/u as outlined.

## 2024-03-18 NOTE — Assessment & Plan Note (Addendum)
Counseled re importance of smoking cessation but did not meet time criteria for separate billing  ° ° °    °  ° °Each maintenance medication was reviewed in detail including emphasizing most importantly the difference between maintenance and prns and under what circumstances the prns are to be triggered using an action plan format where appropriate. ° °Total time for H and P, chart review, counseling, reviewing hfa device(s) and generating customized AVS unique to this office visit / same day charting =  34 min  °     °

## 2024-05-14 NOTE — Progress Notes (Signed)
 Joel Castillo, male    DOB: 1961/04/20    MRN: [id number]   Brief patient profile:  58  yowm  active smoker/MM  referred to pulmonary clinic in [place]  07/16/2023 by Rayfield Molt NP  for progressive doe x 2014 ? COPD and SPN incidentally discovered on Shoulder CT 05/18/23 x 1 cm spiculated  with GOLD 3 criteria for COPD established 10/2023      History of Present Illness  07/16/2023  Pulmonary/ 1st office eval/ Cyndia Degraff / [facility]  Chief Complaint  Patient presents with   Consult  Dyspnea:  food lion x sev aisles / gets let off at door  Cough: worse in am/ light green x 7 months prior to OV  - never heme Sleep: flat bed with 3 pillows  SABA use: none lately  02: none  Rec Suggested e-cigs as an optional  one way bridge  Off all tobacco products  Start Trelegy 100 is one click first thing each am  Please schedule a follow up office visit in 6 weeks, call sooner if needed with all medications /inhalers/ solutions in hand    Late add: needs pfts next available > not done as of 09/06/2023    09/06/2023  f/u ov/[place] office/Edna Rede re: MPN maint on no rx  did not bring meds Chief Complaint  Patient presents with   Lung Nodule   Dyspnea:  no change food lion x one or two aisles then stops due to sob/ not sure whether trelegy or saba helps or not  Cough: 1st thing am mucoid  Sleeping: bed is flat with several pillows n   resp cc  SABA use: none  02: none  Rec The key is to stop smoking completely before smoking completely stops you! Be sure to go for your lung function tests Oct 07 2023 and I will call results   if your breathing or cough bother you but you must bring all your medications with you including over the counters   01/03/2024  f/u ov/[place] office/Muzamil Harker re:   Gold 3 COPD  maint on nothing did   bring meds - no resp rx  Chief Complaint  Patient presents with   Shortness of Breath  Dyspnea:  stili able to do food lion slow pace  Cough: worse in am > slt   green tint  Sleeping: flat bed / 2 pillows  s resp cc while asleep SABA use: ;none  02: none  Lung cancer screening: due for CT 02/2024  Rec Plan A = Automatic = Always=    Breztri (mustard) or Symbort 160 (ketchup)  Take 2 puffs first thing in am and then another 2 puffs about 12 hours later.  Work on inhaler technique:    Plan B = Backup (to supplement plan A, not to replace it) Only use your albuterol  inhaler as a rescue medication Please remember to go to the lab department   for your tests - we will call you with the results when they are available. The key is to stop smoking completely before smoking completely stops you! 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   CT w/o contrast 03/08/24 > no change, returned to [facility] as he is active smoker      03/16/2024  f/u ov/Domique Clapper re: COPD GOLD 3/ SPN   maint on symbicort  160 but not consistent with it/insurance won't cover breztri/  still smoking  Chief Complaint  Patient presents with   Follow-up    COPD and CT f/u    Dyspnea:  more doe but still able to do food lion slow 2-3 aisles  Cough: white thick2 Sleeping: flat bed 2 pillows some wheezing  resp cc  SABA use: does not have any  02: none   Lung cancer screening :  in program   Rec Plan A = Automatic = Always=    Symbicort  160 Take 2 puffs first thing in am and then another 2 puffs about 12 hours later.   Plan B = Backup (to supplement plan A, not to replace it) Only use your albuterol  inhaler as a rescue medication Work on inhaler technique:   Prednisone  10 mg take  4 each am x 2 days,   2 each am x 2 days,  1 each am x 2 days and stop   The key is to stop smoking completely before smoking completely stops you!  Please schedule a follow up office visit in 6 weeks ([facility]) call sooner if needed with all medications /inhalers/ solutions in hand    05/16/2024  f/u ov/[place] [facility]/Jarious Lyon re: COPD GOLD 3/ SPN     maint on symbicort   160 did  bring all meds - still smoking  Chief Complaint  Patient presents with   COPD    CT wo contrast 03/14/24    Dyspnea:  food lion x one-half aisle  (not reproduced in office)  Cough: rattling worse in  am x sev min mucioid Sleeping: flat bed 2 pillows no  resp cc  SABA use: one inhaler does not last a month, does not pretreat 02: none   Lung cancer screening: q June    No obvious day to day or daytime variability or assoc   mucus plugs or hemoptysis or cp or chest tightness, subjective wheeze or overt sinus or hb symptoms.    Also denies any obvious fluctuation of symptoms with weather or environmental changes or other aggravating or alleviating factors except as outlined above   No unusual exposure hx or h/o childhood pna/ asthma or knowledge of premature birth.  Current Allergies, Complete Past Medical History, Past Surgical History, Family History, and Social History were reviewed in [employer] record.  ROS  The following are not active complaints unless bolded Hoarseness, sore throat, dysphagia, dental problems, itching, sneezing,  nasal congestion or discharge of excess mucus or purulent secretions, ear ache,   fever, chills, sweats, unintended wt loss or wt gain, classically pleuritic or exertional cp,  orthopnea pnd or arm/hand swelling  or leg swelling, presyncope, palpitations, abdominal pain, anorexia, nausea, vomiting, diarrhea  or change in bowel habits or change in bladder habits, change in stools or change in urine, dysuria, hematuria,  rash, arthralgias/wears back brace, visual complaints, headache, numbness, weakness or ataxia or problems with walking or coordination,  change in mood or  memory.        Current Meds  Medication Sig   albuterol  (PROAIR  HFA) 108 (90 Base) MCG/ACT inhaler 2 puffs every 4 hours as needed only  if your can't catch your breath   aspirin  EC 81 MG tablet Take 1 tablet (81 mg total) by mouth daily.    atorvastatin  (LIPITOR) 40 MG tablet Take 40 mg by mouth daily with lunch.   budesonide -formoterol  (SYMBICORT ) 160-4.5 MCG/ACT inhaler Take 2 puffs first thing in am and then another 2 puffs about 12 hours later.   folic  acid (FOLVITE) 1 MG tablet Take 1 mg by mouth daily.   mirtazapine  (REMERON ) 15 MG tablet Take 15 mg by mouth at bedtime.   naloxegol  oxalate (MOVANTIK ) 25 MG TABS tablet Take 1 tablet (25 mg total) by mouth daily. Take 1 hour before or 2 hours after a meal.   oxyCODONE -acetaminophen  (PERCOCET) 10-325 MG tablet Take 1 tablet by mouth 4 (four) times daily as needed for pain.   pregabalin  (LYRICA ) 200 MG capsule Take 200 mg by mouth every 8 (eight) hours.           Past Medical History:  Diagnosis Date   Chronic abdominal pain    Chronic back pain    Chronic bronchitis    Chronic nausea    Complication of anesthesia 11/22/1998   sat O2 dropped during surgery   COPD (chronic obstructive pulmonary disease) (HCC)    Depression    GERD (gastroesophageal reflux disease)    H. pylori infection 03/05/2012   treated with prevpac   Hyperplastic colon polyp    Hypertension    Lumbago    Myocardial infarction (HCC)    2018   Rectal bleeding    Shortness of breath    Sigmoid diverticulosis    Tubulovillous adenoma       Objective:    wts  05/16/2024        162  03/16/2024       160  01/03/2024       160 09/06/2023       168    07/16/23 159 lb (72.1 kg)  12/17/22 152 lb (68.9 kg)  10/04/19 199 lb 3.2 oz (90.4 kg)    Vital signs reviewed  05/16/2024  - Note at rest 02 sats  95% on RA   General appearance:    elderly wm / back brace/ some pw better with plb      HEENT : Oropharynx  clear /edentulous  Nasal turbinates nl    NECK :  without  apparent JVD/ palpable Nodes/TM    LUNGS: no acc muscle use,  Min barrel  contour chest wall with bilateral  slightly decreased bs s audible wheeze and  without cough on insp or exp maneuvers and min  Hyperresonant  to   percussion bilaterally    CV:  RRR  no s3 or murmur or increase in P2, and no edema   ABD:  soft and nontender with pos end  insp Hoover's  in the supine position.  No bruits or organomegaly appreciated   MS:  ext warm without deformities Or obvious joint restrictions  calf tenderness, cyanosis or clubbing - wearing back brace    SKIN: warm and dry without lesions    NEURO:  alert, approp, nl sensorium with  no motor or cerebellar deficits apparent.                   Assessment   Assessment & Plan COPD GOLD 3 Active smoker/MM   - 07/16/2023  After extensive coaching inhaler device,  effectiveness =  80% with dpi > try trelegy 100 sample  x 6 weeks and return to eval response > says no change on vs off but really not very active  - 09/06/2023   Walked on RA  x  3  lap(s) =  approx 450  ft  @ mod pace, stopped due to end of study  with lowest 02 sats 94% s sob  - PFT's  10/07/23  FEV1 1.35 (39 % )  ratio 0.45  p 26 % improvement from saba p 0 prior to study with DLCO  11.80 (44%)   and FV curve classically concave   - 01/03/2024  After extensive coaching inhaler device,  effectiveness = 80% > try symbicort  160 if can't get breztri thru insurance  - Allergy screen 01/03/2024 >  Eos 0.2  And 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  - 05/16/2024  After extensive coaching inhaler device,  effectiveness =    90% with hfa and 90% with SMI > start spiriva  2.5  -  05/16/2024   Walked on RA  x  3  lap(s) =  approx 450  ft  @ mod pace, stopped due to end of study  with lowest 02 sats 94% and upper airway wheeze    Group D (now reclassified as E) in terms of symptom/risk and laba/lama/ICS  therefore appropriate rx at this point >>>  symbicort  160/ spiriva  2.5 and more approp saba:  Re SABA :  I spent extra time with pt today reviewing appropriate use of albuterol  for prn use on exertion with the following points: 1) saba is for relief of sob that does not improve by walking a slower pace or resting but rather  if the pt does not improve after trying this first. 2) If the pt is convinced, as many are, that saba helps recover from activity faster then it's easy to tell if this is the case by re-challenging : ie stop, take the inhaler, then p 5 minutes try the exact same activity (intensity of workload) that just caused the symptoms and see if they are substantially diminished or not after saba 3) if there is an activity that reproducibly causes the symptoms, try the saba 15 min before the activity on alternate days   If in fact the saba really does help, then fine to continue to use it prn but advised may need to look closer at the maintenance regimen being used to achieve better control of airways disease with exertion.    Cigarette smoker 4-5 min discussion re active cigarette smoking in addition to office E&M  Ask about tobacco use:   ongoing Advise quitting   I took an extended  opportunity with this patient to outline the consequences of continued cigarette use  in airway disorders based on all the data we have from the multiple national lung health studies (perfomed over decades at millions of dollars in cost)  indicating that smoking cessation, not choice of inhalers or pulmonary physicians, is the most important aspect of his care.   Assess willingness:  Not committed at this point Assist in quit attempt:  Per PCP when ready Arrange follow up:   Follow up per Primary Care planned       Solitary pulmonary nodule on lung CT Active smoker  - Shoulder CT 05/18/23 x 1 cm spiculated  > rec f/u ct 08/19/23 whole lung  - CT chest   10/18/22  Spiculated nodule in the inferior right upper lobe is essentially stable since prior study, 9 mm compared to 10 mm previously. Clustered nodules in the right lower lobe with conglomerate nodular area measuring up to 1.9 cm. Scattered left lower lobe pulmonary nodules measuring up to 5 mm CT w/o contrast 03/08/24 > no change, rec return  to [facility] as he is active  smoker   Discussed in detail all the  indications, usual  risks and alternatives  relative to the benefits with patient who agrees to proceed  with w/u as outlined.     Each maintenance medication was reviewed in detail including emphasizing most importantly the difference between maintenance and prns and under what circumstances the prns are to be triggered using an action plan format where appropriate.  Total time for H and P, chart review, counseling, reviewing hfa/smi device(s) , directly observing portions of ambulatory 02 saturation study/ and generating customized AVS unique to this office visit / same day charting = 30 min                   Patient Instructions  Plan A = Automatic = Always=    Symbicort   160 2 puffs 1st thing in am and 12 hours later Spiriva  2 puffs right after your AM Symbicort   - reduce dose of spiriva  if worse constipation or urination issues   Work on inhaler technique:  relax and gently blow all the way out then take a nice smooth full deep breath back in, triggering the inhaler at same time you start breathing in.  Hold breath in for at least  5 seconds if you can. Blow out symbicort   thru nose. Rinse and gargle with water  when done.  If mouth or throat bother you at all,  try brushing teeth/gums/tongue with arm and hammer toothpaste/ make a slurry and gargle and spit out.       Plan B = Backup (to supplement plan A, not to replace it) Use your albuterol  inhaler as a rescue medication to be used if you can't catch your breath by resting or slowing your pace  or doing a relaxed purse lip breathing pattern.  - The less you use it, the better it will work when you need it. - Ok to use the inhaler up to 2 puffs  every 4 hours if you must but call for appointment if use goes up over your usual need - Don't leave home without it !!  (think of it like the spare tire or starter fluid for your car)   Also  Ok to try albuterol  15 min before an activity (on alternating  days)  that you know would usually make you short of breath and see if it makes any difference and if makes none then don't take albuterol  after activity unless you can't catch your breath as this means it's the resting that helps, not the albuterol .      The key is to stop smoking completely before smoking completely stops you!  For smoking cessation classes call [phone]   Please schedule a follow up visit in 3 months but call sooner if needed - bring rescue inhaler         Ozell America, MD 05/16/2024

## 2024-05-16 ENCOUNTER — Encounter: Payer: Self-pay | Admitting: Internal Medicine

## 2024-05-16 ENCOUNTER — Ambulatory Visit: Admitting: Internal Medicine

## 2024-05-16 VITALS — BP 118/71 | HR 105 | Ht 69.0 in | Wt 162.4 lb

## 2024-05-16 DIAGNOSIS — F17210 Nicotine dependence, cigarettes, uncomplicated: Secondary | ICD-10-CM | POA: Diagnosis not present

## 2024-05-16 DIAGNOSIS — I10 Essential (primary) hypertension: Secondary | ICD-10-CM

## 2024-05-16 DIAGNOSIS — J449 Chronic obstructive pulmonary disease, unspecified: Secondary | ICD-10-CM

## 2024-05-16 DIAGNOSIS — R911 Solitary pulmonary nodule: Secondary | ICD-10-CM

## 2024-05-16 MED ORDER — SPIRIVA RESPIMAT 2.5 MCG/ACT IN AERS: 2.0000 | INHALATION_SPRAY | Freq: Every day | RESPIRATORY_TRACT | Status: AC

## 2024-05-16 MED ORDER — SPIRIVA RESPIMAT 2.5 MCG/ACT IN AERS: 2.0000 | INHALATION_SPRAY | Freq: Every day | RESPIRATORY_TRACT | 11 refills | Status: DC

## 2024-05-16 NOTE — Patient Instructions (Addendum)
 Plan A = Automatic = Always=    Symbicort   160 2 puffs 1st thing in am and 12 hours later Spiriva  2 puffs right after your AM Symbicort   - reduce dose of spiriva  if worse constipation or urination issues   Work on inhaler technique:  relax and gently blow all the way out then take a nice smooth full deep breath back in, triggering the inhaler at same time you start breathing in.  Hold breath in for at least  5 seconds if you can. Blow out symbicort   thru nose. Rinse and gargle with water  when done.  If mouth or throat bother you at all,  try brushing teeth/gums/tongue with arm and hammer toothpaste/ make a slurry and gargle and spit out.       Plan B = Backup (to supplement plan A, not to replace it) Use your albuterol  inhaler as a rescue medication to be used if you can't catch your breath by resting or slowing your pace  or doing a relaxed purse lip breathing pattern.  - The less you use it, the better it will work when you need it. - Ok to use the inhaler up to 2 puffs  every 4 hours if you must but call for appointment if use goes up over your usual need - Don't leave home without it !!  (think of it like the spare tire or starter fluid for your car)   Also  Ok to try albuterol  15 min before an activity (on alternating days)  that you know would usually make you short of breath and see if it makes any difference and if makes none then don't take albuterol  after activity unless you can't catch your breath as this means it's the resting that helps, not the albuterol .      The key is to stop smoking completely before smoking completely stops you!  For smoking cessation classes call [phone]   Please schedule a follow up visit in 3 months but call sooner if needed - bring rescue inhaler

## 2024-05-16 NOTE — Assessment & Plan Note (Addendum)

## 2024-05-16 NOTE — Assessment & Plan Note (Addendum)
 Active smoker/MM   - 07/16/2023  After extensive coaching inhaler device,  effectiveness =  80% with dpi > try trelegy 100 sample  x 6 weeks and return to eval response > says no change on vs off but really not very active  - 09/06/2023   Walked on RA  x  3  lap(s) =  approx 450  ft  @ mod pace, stopped due to end of study  with lowest 02 sats 94% s sob  - PFT's  10/07/23  FEV1 1.35 (39 % ) ratio 0.45  p 26 % improvement from saba p 0 prior to study with DLCO  11.80 (44%)   and FV curve classically concave   - 01/03/2024  After extensive coaching inhaler device,  effectiveness = 80% > try symbicort  160 if can't get breztri thru insurance  - Allergy screen 01/03/2024 >  Eos 0.2  And 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  - 05/16/2024  After extensive coaching inhaler device,  effectiveness =    90% with hfa and 90% with SMI > start spiriva  2.5  -  05/16/2024   Walked on RA  x  3  lap(s) =  approx 450  ft  @ mod pace, stopped due to end of study  with lowest 02 sats 94% and upper airway wheeze    Group D (now reclassified as E) in terms of symptom/risk and laba/lama/ICS  therefore appropriate rx at this point >>>  symbicort  160/ spiriva  2.5 and more approp saba:  Re SABA :  I spent extra time with pt today reviewing appropriate use of albuterol  for prn use on exertion with the following points: 1) saba is for relief of sob that does not improve by walking a slower pace or resting but rather if the pt does not improve after trying this first. 2) If the pt is convinced, as many are, that saba helps recover from activity faster then it's easy to tell if this is the case by re-challenging : ie stop, take the inhaler, then p 5 minutes try the exact same activity (intensity of workload) that just caused the symptoms and see if they are substantially diminished or not after saba 3) if there is an activity that reproducibly causes the symptoms, try the saba 15 min before the activity on alternate days   If in fact the  saba really does help, then fine to continue to use it prn but advised may need to look closer at the maintenance regimen being used to achieve better control of airways disease with exertion.

## 2024-05-17 NOTE — Assessment & Plan Note (Addendum)
 Active smoker  - Shoulder CT 05/18/23 x 1 cm spiculated  > rec f/u ct 08/19/23 whole lung  - CT chest   10/18/22  Spiculated nodule in the inferior right upper lobe is essentially stable since prior study, 9 mm compared to 10 mm previously. Clustered nodules in the right lower lobe with conglomerate nodular area measuring up to 1.9 cm. Scattered left lower lobe pulmonary nodules measuring up to 5 mm CT w/o contrast 03/08/24 > no change, rec return  to [facility] as he is active smoker   Discussed in detail all the  indications, usual  risks and alternatives  relative to the benefits with patient who agrees to proceed with w/u as outlined.     Each maintenance medication was reviewed in detail including emphasizing most importantly the difference between maintenance and prns and under what circumstances the prns are to be triggered using an action plan format where appropriate.  Total time for H and P, chart review, counseling, reviewing hfa/smi device(s) , directly observing portions of ambulatory 02 saturation study/ and generating customized AVS unique to this office visit / same day charting = 30 min

## 2024-05-30 ENCOUNTER — Other Ambulatory Visit (HOSPITAL_BASED_OUTPATIENT_CLINIC_OR_DEPARTMENT_OTHER): Payer: Self-pay

## 2024-06-16 ENCOUNTER — Encounter: Payer: Self-pay | Admitting: Gastroenterology

## 2024-06-16 ENCOUNTER — Ambulatory Visit: Admitting: Gastroenterology

## 2024-06-16 VITALS — BP 118/80 | HR 103 | Temp 97.5°F | Ht 69.0 in | Wt 163.4 lb

## 2024-06-16 DIAGNOSIS — K5903 Drug induced constipation: Secondary | ICD-10-CM

## 2024-06-16 DIAGNOSIS — R1032 Left lower quadrant pain: Secondary | ICD-10-CM | POA: Diagnosis not present

## 2024-06-16 MED ORDER — LUBIPROSTONE 24 MCG PO CAPS: ORAL_CAPSULE | ORAL | 11 refills | Status: AC

## 2024-06-16 NOTE — Patient Instructions (Addendum)
 Please make sure your medication list is correct. Make sure you are taking naloxegol  oxalate (Movantik ) 25mg  daily. Call is we need to make changes to your list after you compare them to your medications at home.  I am adding another medication (lubiprostone ) to help with your constipation as it is still not adequately controlled. You will take it once to twice daily to improve your bowels.   Our goal is for you to have a good soft stool at least every other day.  Add high fiber diet to help with your bowels and the diverticulosis.  Return to the office to see Dr. Shaaron in 6 weeks for follow up.

## 2024-06-16 NOTE — Progress Notes (Signed)
 GI Office Note    Referring Provider: Joshua Rayfield RAMAN, NP Primary Care Physician:  Joshua Rayfield RAMAN, NP  Primary Gastroenterologist: Ozell Hollingshead, MD   Chief Complaint   Chief Complaint  Patient presents with   Follow-up    Doing well, no issues to discuss    History of Present Illness   OBEDIAH WELLES is a 63 y.o. male presenting today for follow up. Last seen 03/2024. He has chronic intermittent LLQ pain and constipation, H.pylori s/p treatment remotely (patient declined eradication testing), h/o adenomatous colon polyps (patient declined colonoscopy).  Discussed the use of AI scribe software for clinical note transcription with the patient, who gave verbal consent to proceed.   He experiences chronic constipation with bowel movements occurring only two to three times a week. The stools are hard and painful at times. No blood is observed in the stool. He is uncertain about his current use of Movantik  (naloxegol ) 25 mg daily, although records indicate it has been filled monthly for the past three months.  He describes persistent abdominal pain located in the lower left quadrant, which has been present for several years. He had diverticulitis by CT in 2016. After ov in 01/2024, I repeated his CT due to ongoing intermittent LLQ pain but no diverticulitis at that time. He had significant stool load. He is attempting to identify dietary triggers, avoiding foods like pizza and spaghetti sauce that he believes exacerbate his symptoms. He has chronic lumbar back issues with broken rods, spinal stenosis. He notes sometimes LLQ pain worse with movement.  He denies any heartburn, dysphagia, vomiting, weight loss.   Patient has history of advanced colon polyp in sigmoid region during 2013 colonoscopy. Prep was poor. He was advised 3 month follow up colonoscopy but he never responded when we tried to get in touch with him to schedule. He has declined the last couple of times I asked at his OVs.     Prior Data    EGD June 2013: Patulous EG junction.  Moderate sized hiatal hernia.  Abnormal gastric mucosa status post biopsy.  Duodenal erosions.  H. pylori gastritis on biopsy.  Treated with Prevpac   Colonoscopy June 2013: Multiple rectal colonic polyps status post removal.  Sigmoid diverticulosis.  Poor preparation. 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  Recommended 73-month follow-up colonoscopy.    Medications   Current Outpatient Medications  Medication Sig Dispense Refill   albuterol  (PROAIR  HFA) 108 (90 Base) MCG/ACT inhaler 2 puffs every 4 hours as needed only  if your can't catch your breath 18 g 11   aspirin  EC 81 MG tablet Take 1 tablet (81 mg total) by mouth daily. 30 tablet 12   atorvastatin  (LIPITOR) 40 MG tablet Take 40 mg by mouth daily with lunch.     budesonide -formoterol  (SYMBICORT ) 160-4.5 MCG/ACT inhaler Take 2 puffs first thing in am and then another 2 puffs about 12 hours later. 1 each 12   cyclobenzaprine (FLEXERIL) 10 MG tablet Take 10 mg by mouth 2 (two) times daily.     folic acid (FOLVITE) 1 MG tablet Take 1 mg by mouth daily.     mirtazapine  (REMERON ) 15 MG tablet Take 15 mg by mouth at bedtime.     naloxegol  oxalate (MOVANTIK ) 25 MG TABS tablet Take 1 tablet (25 mg total) by mouth daily. Take 1 hour before or 2 hours after a meal. 30 tablet 5   oxyCODONE -acetaminophen  (  PERCOCET) 10-325 MG tablet Take 1 tablet by mouth 4 (four) times daily as needed for pain.     pregabalin  (LYRICA ) 25 MG capsule Take 25 mg by mouth 3 (three) times daily.     Tiotropium Bromide Monohydrate  (SPIRIVA  RESPIMAT) 2.5 MCG/ACT AERS Inhale 2 puffs into the lungs daily.     No current facility-administered medications for this visit.    Allergies   Allergies as of 06/16/2024   (No Known Allergies)      Review of Systems   General: Negative for anorexia, weight loss, fever, chills, fatigue,  weakness. ENT: Negative for hoarseness, difficulty swallowing , nasal congestion. CV: Negative for chest pain, angina, palpitations, dyspnea on exertion, peripheral edema.  Respiratory: Negative for dyspnea at rest, dyspnea on exertion, cough, sputum, wheezing.  GI: See history of present illness. GU:  Negative for dysuria, hematuria, urinary incontinence, urinary frequency, nocturnal urination.  Endo: Negative for unusual weight change.     Physical Exam   BP 118/80 (BP Location: Right Arm, Patient Position: Sitting, Cuff Size: Normal)   Pulse (!) 103   Temp (!) 97.5 F (36.4 C) (Oral)   Ht 5' 9 (1.753 m)   Wt 163 lb 6.4 oz (74.1 kg)   SpO2 94%   BMI 24.13 kg/m    General: Well-nourished, well-developed in no acute distress.  Eyes: No icterus. Mouth: Oropharyngeal mucosa moist and pink   Abdomen: Bowel sounds are normal, nontender, nondistended, no hepatosplenomegaly or masses,  no abdominal bruits or hernia , no rebound or guarding. Tender over right hip area. Rectal: not performed Extremities: No lower extremity edema. No clubbing or deformities. Neuro: Alert and oriented x 4   Skin: Warm and dry, no jaundice.   Psych: Alert and cooperative, normal mood and affect.  Labs   Lab Results  Component Value Date   WBC 8.9 01/03/2024   HGB 14.5 01/03/2024   HCT 44.1 01/03/2024   MCV 89 01/03/2024   PLT 319 01/03/2024    Imaging Studies   No results found.  Assessment/Plan:       Chronic intermittent LLQ pain/constipation: history of diverticulitis in the past, but no inflammation seen on CT in 01/2024. His symptoms may be exacerbated by his constipation. Cannot rule out pain from MSK source at left hip, referred from back, or pathology within the colon. He continues to decline colonoscopy, willing to take risk that he could have underlying cancerous lesion within the colon and that delay in diagnosis could lead to poor outcome.  -aggressively manage his  constipation -return ov in six weeks with Dr. Shaaron.   Chronic constipation, likely opioid-induced: -poorly managed  -verify Movantik  25mg  daily usage. He will also verify other medications on our list are correct. -add lubiprostone  24mcg once to twice daily. -goal of soft BM every other day -high fiber diet       Sonny RAMAN. Ezzard, MHS, PA-C [facility]

## 2024-06-22 ENCOUNTER — Encounter: Payer: Self-pay | Admitting: Gastroenterology

## 2024-08-16 ENCOUNTER — Ambulatory Visit: Admitting: Internal Medicine

## 2024-08-22 ENCOUNTER — Ambulatory Visit: Admitting: Internal Medicine

## 2024-08-22 DIAGNOSIS — J449 Chronic obstructive pulmonary disease, unspecified: Secondary | ICD-10-CM

## 2024-08-22 NOTE — Progress Notes (Deleted)
 Joel Castillo, male    DOB: 06-22-1961    MRN: [id number]   Brief patient profile:  31  yowm  active smoker/MM  referred to pulmonary clinic in [place]  07/16/2023 by Joel Molt NP  for progressive doe x 2014 ? COPD and SPN incidentally discovered on Shoulder CT 05/18/23 x 1 cm spiculated  with GOLD 3 criteria for COPD established 10/2023      History of Present Illness  07/16/2023  Pulmonary/ 1st office eval/ Joel Castillo / [facility]  Chief Complaint  Patient presents with   Consult  Dyspnea:  food lion x sev aisles / gets let off at door  Cough: worse in am/ light green x 7 months prior to OV  - never heme Sleep: flat bed with 3 pillows  SABA use: none lately  02: none  Rec Suggested e-cigs as an optional  one way bridge  Off all tobacco products  Start Trelegy 100 is one click first thing each am  Please schedule a follow up office visit in 6 weeks, call sooner if needed with all medications /inhalers/ solutions in hand    Late add: needs pfts next available > not done as of 09/06/2023    09/06/2023  f/u ov/[facility] office/Joel Castillo re: MPN maint on no rx  did not bring meds Chief Complaint  Patient presents with   Lung Nodule   Dyspnea:  no change food lion x one or two aisles then stops due to sob/ not sure whether trelegy or saba helps or not  Cough: 1st thing am mucoid  Sleeping: bed is flat with several pillows n   resp cc  SABA use: none  02: none  Rec The key is to stop smoking completely before smoking completely stops you! Be sure to go for your lung function tests Oct 07 2023 and I will call results   if your breathing or cough bother you but you must bring all your medications with you including over the counters   01/03/2024  f/u ov/[facility] office/Joel Castillo re:   Gold 3 COPD  maint on nothing did   bring meds - no resp rx  Chief Complaint  Patient presents with   Shortness of Breath  Dyspnea:  stili able to do food lion slow pace  Cough: worse in am > slt   green tint  Sleeping: flat bed / 2 pillows  s resp cc while asleep SABA use: ;none  02: none  Lung cancer screening: due for CT 02/2024  Rec Plan A = Automatic = Always=    Breztri (mustard) or Symbort 160 (ketchup)  Take 2 puffs first thing in am and then another 2 puffs about 12 hours later.  Work on inhaler technique:    Plan B = Backup (to supplement plan A, not to replace it) Only use your albuterol  inhaler as a rescue medication Please remember to go to the lab department   for your tests - we will call you with the results when they are available. The key is to stop smoking completely before smoking completely stops you! 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   CT w/o contrast 03/08/24 > no change, returned to [facility] as he is active smoker      03/16/2024  f/u ov/Joel Castillo re: COPD GOLD 3/ SPN   maint on symbicort  160 but not consistent with it/insurance won't cover breztri/  still smoking  Chief Complaint  Patient presents with   Follow-up    COPD and CT f/u    Dyspnea:  more doe but still able to do food lion slow 2-3 aisles  Cough: white thick2 Sleeping: flat bed 2 pillows some wheezing  resp cc  SABA use: does not have any  02: none   Lung cancer screening :  in program   Rec Plan A = Automatic = Always=    Symbicort  160 Take 2 puffs first thing in am and then another 2 puffs about 12 hours later.   Plan B = Backup (to supplement plan A, not to replace it) Only use your albuterol  inhaler as a rescue medication Work on inhaler technique:   Prednisone  10 mg take  4 each am x 2 days,   2 each am x 2 days,  1 each am x 2 days and stop   The key is to stop smoking completely before smoking completely stops you!  Please schedule a follow up office visit in 6 weeks ([facility] office) call sooner if needed with all medications /inhalers/ solutions in hand     05/16/2024  f/u ov/[facility] office/Joel Castillo re: COPD GOLD 3/ SPN     maint on symbicort   160 did  bring all meds - still smoking  Chief Complaint  Patient presents with   COPD    CT wo contrast 03/14/24    Dyspnea:  food lion x one-half aisle  (not reproduced in office)  Cough: rattling worse in  am x sev min mucioid Sleeping: flat bed 2 pillows no  resp cc  SABA use: one inhaler does not last a month, does not pretreat 02: none  Patient Instructions  Plan A = Automatic = Always=    Symbicort   160 2 puffs 1st thing in am and 12 hours later Spiriva  2 puffs right after your AM Symbicort   - reduce dose of spiriva  if worse constipation or urination issues  Work on inhaler technique   Plan B = Backup (to supplement plan A, not to replace it) Use your albuterol  inhaler as a rescue medicatio Also  Ok to try albuterol  15 min before an activity (on alternating days)  that you know would usually make you short of breath     The key is to stop smoking completely before smoking completely stops you! Please schedule a follow up visit in 3 months but call sooner if needed - bring rescue inhaler   '   08/22/2024  f/u ov/ office/Joel Castillo re: *** maint on ***  *** smoker   did *** bring inhaler No chief complaint on file.   Dyspnea:  *** Cough: *** Sleeping: ***   resp cc  SABA use: *** 02: ***  Lung cancer screening: ***   No obvious day to day or daytime variability or assoc excess/ purulent sputum or mucus plugs or hemoptysis or cp or chest tightness, subjective wheeze or overt sinus or hb symptoms.    Also denies any obvious fluctuation of symptoms with weather or environmental changes or other aggravating or alleviating factors except as outlined above   No unusual exposure hx or h/o childhood pna/ asthma or knowledge of premature birth.  Current Allergies, Complete Past Medical History, Past Surgical History, Family History, and Social History were reviewed in Joel Castillo record.  ROS  The following are not active  complaints unless bolded Hoarseness, sore throat, dysphagia, dental problems, itching, sneezing,  nasal congestion or discharge of  excess mucus or purulent secretions, ear ache,   fever, chills, sweats, unintended wt loss or wt gain, classically pleuritic or exertional cp,  orthopnea pnd or arm/hand swelling  or leg swelling, presyncope, palpitations, abdominal pain, anorexia, nausea, vomiting, diarrhea  or change in bowel habits or change in bladder habits, change in stools or change in urine, dysuria, hematuria,  rash, arthralgias, visual complaints, headache, numbness, weakness or ataxia or problems with walking or coordination,  change in mood or  memory.        No outpatient medications have been marked as taking for the 08/22/24 encounter (Appointment) with Joel Ozell NOVAK, Joel Castillo.           Past Medical History:  Diagnosis Date   Chronic abdominal pain    Chronic back pain    Chronic bronchitis    Chronic nausea    Complication of anesthesia 11/22/1998   sat O2 dropped during surgery   COPD (chronic obstructive pulmonary disease) (HCC)    Depression    GERD (gastroesophageal reflux disease)    H. pylori infection 03/05/2012   treated with prevpac   Hyperplastic colon polyp    Hypertension    Lumbago    Myocardial infarction (HCC)    2018   Rectal bleeding    Shortness of breath    Sigmoid diverticulosis    Tubulovillous adenoma       Objective:    wts 08/22/2024       ***  05/16/2024        162  03/16/2024       160  01/03/2024       160 09/06/2023       168    07/16/23 159 lb (72.1 kg)  12/17/22 152 lb (68.9 kg)  10/04/19 199 lb 3.2 oz (90.4 kg)    Vital signs reviewed  08/22/2024  - Note at rest 02 sats  ***% on ***   General appearance:    ***     Min barr***                  Assessment

## 2024-10-05 ENCOUNTER — Encounter: Payer: Self-pay | Admitting: Gastroenterology
